# Patient Record
Sex: Female | Born: 1960
Health system: Southern US, Community
[De-identification: ages and names within clinical notes are randomized; demographics above are authoritative.]

## PROBLEM LIST (undated history)

## (undated) DIAGNOSIS — H5440 Blindness, one eye, unspecified eye: Secondary | ICD-10-CM

## (undated) DIAGNOSIS — I639 Cerebral infarction, unspecified: Secondary | ICD-10-CM

## (undated) DIAGNOSIS — I10 Essential (primary) hypertension: Secondary | ICD-10-CM

## (undated) DIAGNOSIS — F419 Anxiety disorder, unspecified: Secondary | ICD-10-CM

## (undated) DIAGNOSIS — E785 Hyperlipidemia, unspecified: Secondary | ICD-10-CM

## (undated) DIAGNOSIS — I214 Non-ST elevation (NSTEMI) myocardial infarction: Secondary | ICD-10-CM

## (undated) HISTORY — PX: TUBAL LIGATION: SHX77

## (undated) HISTORY — DX: Cerebral infarction, unspecified: I63.9

## (undated) HISTORY — DX: Non-ST elevation (NSTEMI) myocardial infarction: I21.4

## (undated) HISTORY — DX: Anxiety disorder, unspecified: F41.9

## (undated) HISTORY — DX: Blindness, one eye, unspecified eye: H54.40

## (undated) HISTORY — DX: Hyperlipidemia, unspecified: E78.5

---

## 2001-10-15 ENCOUNTER — Emergency Department (HOSPITAL_COMMUNITY): Admission: EM | Admit: 2001-10-15 | Discharge: 2001-10-15 | Payer: Self-pay | Admitting: Emergency Medicine

## 2003-10-04 ENCOUNTER — Emergency Department (HOSPITAL_COMMUNITY): Admission: EM | Admit: 2003-10-04 | Discharge: 2003-10-04 | Payer: Self-pay | Admitting: *Deleted

## 2008-04-27 ENCOUNTER — Emergency Department (HOSPITAL_COMMUNITY): Admission: EM | Admit: 2008-04-27 | Discharge: 2008-04-27 | Payer: Self-pay | Admitting: Emergency Medicine

## 2008-05-01 ENCOUNTER — Emergency Department (HOSPITAL_COMMUNITY): Admission: EM | Admit: 2008-05-01 | Discharge: 2008-05-01 | Payer: Self-pay | Admitting: Emergency Medicine

## 2010-04-18 LAB — POCT I-STAT, CHEM 8
Calcium, Ion: 1.17 mmol/L (ref 1.12–1.32)
HCT: 36 % (ref 36.0–46.0)
Hemoglobin: 12.2 g/dL (ref 12.0–15.0)
Sodium: 142 mEq/L (ref 135–145)

## 2010-10-29 ENCOUNTER — Inpatient Hospital Stay (HOSPITAL_COMMUNITY)
Admission: EM | Admit: 2010-10-29 | Discharge: 2010-10-31 | DRG: 287 | Disposition: A | Discharge status: Home / Self Care | Payer: Self-pay | Attending: Cardiology | Admitting: Cardiology

## 2010-10-29 ENCOUNTER — Emergency Department (HOSPITAL_COMMUNITY): Payer: Self-pay

## 2010-10-29 ENCOUNTER — Inpatient Hospital Stay (INDEPENDENT_AMBULATORY_CARE_PROVIDER_SITE_OTHER)
Admission: RE | Admit: 2010-10-29 | Discharge: 2010-10-29 | Disposition: A | Discharge status: Home / Self Care | Payer: 59 | Source: Ambulatory Visit | Attending: Emergency Medicine | Admitting: Emergency Medicine

## 2010-10-29 DIAGNOSIS — E876 Hypokalemia: Secondary | ICD-10-CM | POA: Diagnosis present

## 2010-10-29 DIAGNOSIS — H5440 Blindness, one eye, unspecified eye: Secondary | ICD-10-CM | POA: Diagnosis present

## 2010-10-29 DIAGNOSIS — R0789 Other chest pain: Principal | ICD-10-CM | POA: Diagnosis present

## 2010-10-29 DIAGNOSIS — I10 Essential (primary) hypertension: Secondary | ICD-10-CM | POA: Diagnosis present

## 2010-10-29 DIAGNOSIS — F17200 Nicotine dependence, unspecified, uncomplicated: Secondary | ICD-10-CM | POA: Diagnosis present

## 2010-10-29 DIAGNOSIS — R42 Dizziness and giddiness: Secondary | ICD-10-CM

## 2010-10-29 DIAGNOSIS — R079 Chest pain, unspecified: Secondary | ICD-10-CM

## 2010-10-29 DIAGNOSIS — R911 Solitary pulmonary nodule: Secondary | ICD-10-CM | POA: Diagnosis present

## 2010-10-29 HISTORY — DX: Essential (primary) hypertension: I10

## 2010-10-29 LAB — COMPREHENSIVE METABOLIC PANEL
ALT: <5 U/L (ref 0–35)
AST: 15 U/L (ref 0–37)
Albumin: 4.3 g/dL (ref 3.5–5.2)
Alkaline Phosphatase: 65 U/L (ref 39–117)
BUN: 12 mg/dL (ref 6–23)
CO2: 24 mEq/L (ref 19–32)
Calcium: 10.2 mg/dL (ref 8.4–10.5)
Chloride: 105 mEq/L (ref 96–112)
Creatinine, Ser: 0.53 mg/dL (ref 0.50–1.10)
GFR calc Af Amer: >90 mL/min (ref >90)
GFR calc non Af Amer: >90 mL/min (ref >90)
Glucose, Bld: 94 mg/dL (ref 70–99)
Potassium: 3.2 mEq/L — ABNORMAL LOW (ref 3.5–5.1)
Sodium: 143 mEq/L (ref 135–145)
Total Bilirubin: 0.4 mg/dL (ref 0.3–1.2)
Total Protein: 8.4 g/dL — ABNORMAL HIGH (ref 6.0–8.3)

## 2010-10-29 LAB — CBC
HCT: 36 % (ref 36.0–46.0)
Hemoglobin: 12.4 g/dL (ref 12.0–15.0)
MCH: 26.6 pg (ref 26.0–34.0)
MCHC: 34.4 g/dL (ref 30.0–36.0)
MCV: 77.3 fL — ABNORMAL LOW (ref 78.0–100.0)
Platelets: 319 10*3/uL (ref 150–400)
RBC: 4.66 MIL/uL (ref 3.87–5.11)
RDW: 15.4 % (ref 11.5–15.5)
WBC: 10.1 10*3/uL (ref 4.0–10.5)

## 2010-10-29 LAB — POCT I-STAT TROPONIN I: Troponin i, poc: 0.52 ng/mL (ref 0.00–0.08)

## 2010-10-29 LAB — CK TOTAL AND CKMB (NOT AT ARMC)
CK, MB: 7.4 ng/mL (ref 0.3–4.0)
Relative Index: 4.9 — ABNORMAL HIGH (ref 0.0–2.5)
Total CK: 150 U/L (ref 7–177)

## 2010-10-30 ENCOUNTER — Encounter (HOSPITAL_COMMUNITY): Payer: Self-pay | Admitting: Radiology

## 2010-10-30 ENCOUNTER — Inpatient Hospital Stay (HOSPITAL_COMMUNITY): Payer: Self-pay

## 2010-10-30 LAB — DIFFERENTIAL
Basophils Absolute: 0 10*3/uL (ref 0.0–0.1)
Basophils Relative: 0 % (ref 0–1)
Eosinophils Relative: 2 % (ref 0–5)
Lymphocytes Relative: 31 % (ref 12–46)
Monocytes Absolute: 0.4 10*3/uL (ref 0.1–1.0)
Neutrophils Relative %: 62 % (ref 43–77)

## 2010-10-30 LAB — CBC
HCT: 35.5 % — ABNORMAL LOW (ref 36.0–46.0)
Hemoglobin: 11.7 g/dL — ABNORMAL LOW (ref 12.0–15.0)
Platelets: 318 10*3/uL (ref 150–400)
RDW: 15.5 % (ref 11.5–15.5)
WBC: 9 10*3/uL (ref 4.0–10.5)

## 2010-10-30 LAB — BASIC METABOLIC PANEL
BUN: 10 mg/dL (ref 6–23)
CO2: 23 mEq/L (ref 19–32)
Chloride: 106 mEq/L (ref 96–112)
GFR calc Af Amer: >90 mL/min (ref >90)
Glucose, Bld: 93 mg/dL (ref 70–99)

## 2010-10-30 LAB — LIPID PANEL: HDL: 44 mg/dL (ref >39)

## 2010-10-30 LAB — CARDIAC PANEL(CRET KIN+CKTOT+MB+TROPI)
CK, MB: 3.6 ng/mL (ref 0.3–4.0)
Relative Index: 2.9 — ABNORMAL HIGH (ref 0.0–2.5)
Total CK: 126 U/L (ref 7–177)
Total CK: 148 U/L (ref 7–177)

## 2010-10-30 LAB — TSH: TSH: 1.038 u[IU]/mL (ref 0.350–4.500)

## 2010-10-30 MED ORDER — IOHEXOL 300 MG/ML  SOLN
80.0000 mL | Freq: Once | INTRAMUSCULAR | Status: AC | PRN
  Administered 2010-10-30: 80 mL via INTRAVENOUS

## 2010-10-31 LAB — CBC
HCT: 33.8 % — ABNORMAL LOW (ref 36.0–46.0)
MCH: 26 pg (ref 26.0–34.0)
MCV: 77.7 fL — ABNORMAL LOW (ref 78.0–100.0)
RBC: 4.35 MIL/uL (ref 3.87–5.11)
WBC: 8.7 10*3/uL (ref 4.0–10.5)

## 2010-10-31 LAB — BASIC METABOLIC PANEL
BUN: 12 mg/dL (ref 6–23)
CO2: 26 mEq/L (ref 19–32)
Calcium: 9.9 mg/dL (ref 8.4–10.5)
Chloride: 104 mEq/L (ref 96–112)
Creatinine, Ser: 0.8 mg/dL (ref 0.50–1.10)
Glucose, Bld: 96 mg/dL (ref 70–99)

## 2010-10-31 LAB — DIFFERENTIAL
Lymphocytes Relative: 29 % (ref 12–46)
Lymphs Abs: 2.6 10*3/uL (ref 0.7–4.0)
Monocytes Relative: 5 % (ref 3–12)
Neutrophils Relative %: 63 % (ref 43–77)

## 2010-11-01 NOTE — Cardiovascular Report (Signed)
Natalie Bishop, Natalie Bishop NO.:  [id number]  MEDICAL RECORD NO.:  [id number]  LOCATION:  2507                         FACILITY:  [facility]  PHYSICIAN:  Verne Carrow, MDDATE OF BIRTH:  May 07, 1960  DATE OF PROCEDURE:  10/30/2010 DATE OF DISCHARGE:                           CARDIAC CATHETERIZATION   PRIMARY CARDIOLOGIST:  Rollene Rotunda, MD, [facility]  PROCEDURES PERFORMED: 1. Left heart catheterization. 2. Selective coronary angiography. 3. Left ventricular angiogram.  OPERATOR:  Verne Carrow, MD  INDICATION:  This is a 50 year old African American female with a history of hypertension, who was admitted to the hospital yesterday with complaints of shortness of breath and chest pain.  Her troponin was elevated at 1.5.  The patient had no EKG changes suggestive of ischemia. She was watched closely overnight.  Her troponin has started to trend back down today.  She has had no recurrent chest pain today.  DETAILS OF PROCEDURE:  The patient was brought to the main cardiac catheterization laboratory after signing informed consent for the procedure.  The right wrist was assessed with an Freida Busman test.  The [place] test was positive.  Right wrist was then prepped and draped in sterile fashion.  Lidocaine 1% was used for local anesthesia.  A 5-French sheath was inserted into the right radial artery without difficulty.  Verapamil 3 mg was given through the sheath after insertion.  4000 units of intravenous heparin was given after sheath insertion.  A JR4 catheter was used to selectively engage and inject the right coronary artery.  We then had considerable difficulty engaging the left main artery with a Judkins left catheter.  Ultimately, we used an EBU 3.0 guiding catheter to selectively engage the left main artery and perform angiography of the left coronary system.  A pigtail catheter was used to perform a left ventricular angiogram.  The patient tolerated the  procedure well.  The sheath was removed from the right radial artery here in the cath lab and a Terumo hemostasis band was applied over the arteriotomy site.  The patient was taken to the holding area in stable condition.  HEMODYNAMIC FINDINGS:  Central aortic pressure 126/79, left ventricular pressure 119/7/8.  ANGIOGRAPHIC FINDINGS: 1. The left main coronary artery had no evidence of disease. 2. The left anterior descending was a large vessel that coursed to the     apex and gave off an early moderate-sized diagonal branch.  There     were no lesions noted in this system. 3. The circumflex artery was a large-caliber vessel and gave off a     relatively small-caliber early obtuse marginal branch that is free     of disease.  Second obtuse marginal branch was a large bifurcating     vessel with no evidence of disease.  The AV groove circumflex was     moderate size with no evidence of disease. 4. The right coronary artery is a large dominant vessel with no     evidence of disease. 5. Left ventricular angiogram was performed in the RAO projection and     showed normal left ventricular systolic function with ejection     fraction of 55%-60%.  IMPRESSION: 1. No angiographic evidence  of coronary artery disease. 2. Normal left ventricular systolic function. 3. Abnormal cardiac enzymes in a patient with atypical chest pain.     Etiology unknown at this time.  I will pursue a workup of pulmonary     embolism.  RECOMMENDATIONS:  At this time, I do not see any objective evidence of coronary artery disease or ischemia.  I would like to check a D-dimer to start the workup for pulmonary embolism.  The patient will be monitored closely for the next 24 hours.     Verne Carrow, MD     CM/MEDQ  D:  10/30/2010  T:  10/30/2010  Job:  [id number]  cc:   Rollene Rotunda, MD, [facility]  Electronically Signed by Verne Carrow MD on 11/01/2010 01:52:17 PM

## 2010-11-01 NOTE — Discharge Summary (Addendum)
NAMEMERLEEN, Natalie Bishop NO.:  [id number]  MEDICAL RECORD NO.:  [id number]  LOCATION:  2507                         FACILITY:  [facility]  PHYSICIAN:  Verne Carrow, MDDATE OF BIRTH:  05/01/1960  DATE OF ADMISSION:  10/29/2010 DATE OF DISCHARGE:  10/31/2010                              DISCHARGE SUMMARY   DISCHARGE DIAGNOSES: 1. Chest pain with elevated cardiac enzymes.     a.     Normal coronary arteries by cath on October 30, 2010.     b.     CT angio negative for pulmonary embolism. 2. Hypertensive urgency. 3. Tobacco abuse. 4. Two 4-mm pulmonary nodules by CT on October 30, 2010, instructed to     follow up with PCP for possible repeat CT in 1 year.  HOSPITAL COURSE:  Natalie Bishop is a 50 year old lady with no prior cardiac history, but a history of hypertension who presented to [facility] with complaints of chest pain.  It was felt somewhat atypical in nature.  She describes as a sticking discomfort above her left breast, that would come and go, with some right breast discomfort worse with movement.  She has also had some heartburn like symptoms and some diaphoresis and hot flashes.  She presented to the ER where she was found to have no acute EKG changes.  However, she was quite hypertensive with a blood pressure of 166/113.  Her initial troponins were positive, 0.52.  She was admitted to the hospital and placed on heparin, aspirin, beta-blockers, and nitroglycerin paste.  She was set up for catheterization the following day demonstrating no evidence of CAD with normal LV function.  Given her positive cardiac enzymes, she had a D- dimer which was just on the cuts of being elevated/normal at 0.48. Subsequent CT angio of the chest demonstrated no evidence for PE. However, two 4-mm nodules were present within the right lung, felt possibly inflammatory, recommending 1 year followup CT per Radiology. On the day of discharge, the patient is feeling  better.  Her medications have been uptitrated for her blood pressure.  Dr. Shirlee Latch has seen and examined her today and feels she is stable for discharge.  DISCHARGE LABS:  WBC 8.7, hemoglobin 11.3, hematocrit 33.8, and platelet count 292.  Sodium 140, potassium 3.5, chloride 104, CO2 of 26, glucose 96, BUN 12, creatinine 0.84.  STUDIES: 1. CT angio of the chest as commented on above. 2. On October 29, 2010, chest x-ray showed retrocardiac atelectasis or     pneumonia.  Please note the patient was afebrile with a normal     white count. 3. Cardiac catheterization on October 30, 2010, please see full report     for details as well as HPI for summary.  DISCHARGE MEDICATIONS: 1. Aspirin 81 mg daily. 2. Lopressor 50 mg b.i.d. 3. Norvasc 5 mg daily.  DISPOSITION:  Natalie Bishop will be discharged in stable condition to home. She is instructed to increase activity slowly and is not to lift anything over 5 pounds for 1 week, drive for 2 days, or participate in sexual activity for 1 week.  She may return to work per Dr. Clifton James. She is to follow a  low-sodium, heart-healthy diet.  She is to call or return for pain, swelling, bleeding, or pus at her cath site.  She will follow up with Tereso Newcomer, [facility], on November 13, 2010, at 10 a.m.  She is also to follow up with her PCP regarding continued monitoring of the pulmonary nodules noted on her CT scan of the chest.  Her cholesterol level should also be followed by her PCP.  Duration of discharge encounter is greater than 30 minutes including physician and PA time.     Ronie Spies, P.A.C.   ______________________________ Verne Carrow, MD    DD/MEDQ  D:  10/31/2010  T:  10/31/2010  Job:  [id number]  Electronically Signed by Verne Carrow MD on 11/01/2010 01:52:19 PM Electronically Signed by Ronie Spies  on 11/06/2010 02:00:20 PM

## 2010-11-06 NOTE — H&P (Addendum)
NAMEZITA, OZIMEK               ACCOUNT NO.:  [id number]  MEDICAL RECORD NO.:  [id number]  LOCATION:  URG                          FACILITY:  [facility]  PHYSICIAN:  Rollene Rotunda, MD, FACCDATE OF BIRTH:  Dec 27, 1960  DATE OF ADMISSION:  10/29/2010 DATE OF DISCHARGE:  10/29/2010                             HISTORY & PHYSICAL   PRIMARY:  None.  CARDIOLOGIST:  None.  REASON FOR CONSULTATION:  Evaluate the patient with chest pain.  HISTORY OF PRESENT ILLNESS:  The patient is a pleasant 50 year old without prior cardiac history.  She has had some atypical chest discomfort.  In retrospect, she has had a little of this in her right chest for over the past couple of months, but it has been very sporadic. She had some discomfort that she described as a "sticking" discomfort above her left breast starting last night.  It would come and go.  She was somewhat dizzy.  She had some right breast discomfort, worse with movement, but also occurring at rest last night.  It was happening again today.  She was somewhat short of breath.  She had some heartburn-like symptoms.  She had some diaphoresis, but she has hot flashes.  She did feel some nausea, but no vomiting.  She presented to the emergency room where she was not found to have acute EKG changes.  However, she is quite hypertensive.  Her cardiac enzyme troponins were slightly elevated.  She is pain free.  PAST MEDICAL HISTORY: 1. Hypertension. 2. 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  SURGICAL HISTORY:  None.  ALLERGIES:  PENICILLIN.  MEDICATIONS:  None.  SOCIAL HISTORY:  The patient is married.  She has 2 children.  She has been smoking for maybe up to 1 and half packs per day for 31 years.  FAMILY HISTORY:  Contributory for her mother having coronary disease in her 26s and dying of diabetes at age 52.  REVIEW OF SYSTEMS:  As stated in the HPI, negative for all other systems.  PHYSICAL EXAMINATION:  GENERAL:  The patient is  pleasant and in no distress. VITAL SIGNS:  Blood pressure 166/113, heart rate 70 and regular, respiratory rate 18, afebrile. HEENT:  Eyes unremarkable.  Right pupil round reactive.  She has dysconjugate gaze and her left pupil is unreactive.  Oral mucosa unremarkable, poor dentition. NECK:  No jugular venous distention at 45 degrees, carotid upstroke brisk and symmetric, no bruits, no thyromegaly. LYMPHATICS:  No cervical, axillary, inguinal adenopathy. LUNGS:  Clear to auscultation bilaterally.  BACK:  No costovertebral angle tenderness. CHEST:  Unremarkable. HEART:  PMI not displaced or sustained, S1 and S2 within normal limits, no S3, no S4, no clicks, rubs, no murmurs. ABDOMEN:  Flat, positive bowel sounds.  Normal in frequency and pitch. No bruits.  No rebound.  No guarding.  No midline pulsatile mass.  No hepatomegaly.  No splenomegaly. SKIN:  No rashes, no nodules. EXTREMITIES:  2+ pulses throughout.  No edema, no cyanosis, no clubbing. NEUROLOGIC:  Oriented to person, place, time, cranial nerves II through XII grossly intact, motor grossly intact.  EKG, sinus rhythm, rate 70, axis within normal limits, intervals within normal limits, no acute ST-T wave  changes.  ASSESSMENT/PLAN: 1. Chest pain.  The patient's chest discomfort is atypical.  However,     she has multiple cardiovascular risk factors and now elevated     troponins.  I will admit her with heparin, aspirin, beta blockers,     and nitroglycerin paste.  I have discussed the risks and benefits     of cardiac catheterization with her and her family.  She     understands and agrees to proceed, which we will do electively. 2. Hypertension.  I will add Norvasc to the medications above. 3. Risk reduction.  I will check a lipid profile and TSH. 4. Tobacco.  We had a long discussion about this and I will get a     smoking consult.     Rollene Rotunda, MD, [facility]   ______________________________ Rollene Rotunda, MD,  [facility]    JH/MEDQ  D:  10/29/2010  T:  10/30/2010  Job:  [id number]  Electronically Signed by Rollene Rotunda MD [facility] on 11/06/2010 05:46:30 PM

## 2010-11-12 ENCOUNTER — Encounter: Payer: Self-pay | Admitting: Physician Assistant

## 2010-11-13 ENCOUNTER — Encounter: Payer: Self-pay | Admitting: Physician Assistant

## 2010-11-13 ENCOUNTER — Ambulatory Visit (INDEPENDENT_AMBULATORY_CARE_PROVIDER_SITE_OTHER): Payer: Self-pay | Admitting: Physician Assistant

## 2010-11-13 VITALS — BP 152/76 | HR 80 | Resp 18 | Ht 66.0 in | Wt 163.1 lb

## 2010-11-13 DIAGNOSIS — I10 Essential (primary) hypertension: Secondary | ICD-10-CM

## 2010-11-13 DIAGNOSIS — E785 Hyperlipidemia, unspecified: Secondary | ICD-10-CM

## 2010-11-13 DIAGNOSIS — I214 Non-ST elevation (NSTEMI) myocardial infarction: Secondary | ICD-10-CM

## 2010-11-13 DIAGNOSIS — D509 Iron deficiency anemia, unspecified: Secondary | ICD-10-CM | POA: Insufficient documentation

## 2010-11-13 DIAGNOSIS — J984 Other disorders of lung: Secondary | ICD-10-CM

## 2010-11-13 DIAGNOSIS — R911 Solitary pulmonary nodule: Secondary | ICD-10-CM

## 2010-11-13 MED ORDER — PRAVASTATIN SODIUM 40 MG PO TABS: 40.0000 mg | ORAL_TABLET | Freq: Every evening | ORAL | Status: DC

## 2010-11-13 MED ORDER — AMLODIPINE BESYLATE 5 MG PO TABS: 10.0000 mg | ORAL_TABLET | Freq: Every day | ORAL | Status: DC

## 2010-11-13 NOTE — Assessment & Plan Note (Signed)
Mild.  Arrange referral to PCP.

## 2010-11-13 NOTE — Patient Instructions (Addendum)
Your physician recommends that you schedule a follow-up appointment in: 01/04/11 @ 12 PM TO SEE SCOTT WEAVER, PA-C   Your physician has recommended you make the following change in your medication: START PRAVASTATIN 40 MG 1 TABLET EVERY NIGHT; INCREASE NORVASC 10 MG DAILY  Your physician recommends that you return for lab work in: 01/04/11 FASTING LIVER/LIPID PANEL SAME DAY YOU HAVE APPT TO SEE SCOTT WEAVER, PA-C @ 12 PM  You have been referred to [facility] TO ESTABLISH WITH A PRIMARY CARE PHYSICIAN FOR HTN; WE WILL ALSO CALL YOU FOR AN APPT TO HAVE A FOLLOW UP CHEST CT FOR YOUR LUNG NODULE 518.89 TO BE DONE 11/08/2011

## 2010-11-13 NOTE — Assessment & Plan Note (Signed)
?   Type 2 related to HTN urgency.  Continue to control BP.

## 2010-11-13 NOTE — Assessment & Plan Note (Signed)
Although no CAD on cath, her LDL is very high and she has significant risk factors.  Recommend statin therapy for primary prevention.  Will start pravastatin 40 mg qhs and check L/L in 6 weeks.  Goal LDL < 130.

## 2010-11-13 NOTE — Assessment & Plan Note (Signed)
Uncontrolled.  Increase norvasc to 10 mg QD.  Follow up in 6 weeks.  If no better, consider adding ACE or HCTZ.  Will arrange PCP referral.

## 2010-11-13 NOTE — Progress Notes (Signed)
History of Present Illness: Primary Cardiologist:  Dr. Rollene Rotunda   Natalie Bishop is a 50 y.o. female who presents for post hospital follow up.  She was admitted 10/22-10/24 with chest pain and HTN urgency (BP 166/113).  She ruled in for NSTEMI.  LHC 10/29/10: normal cors, EF 55-60%.  DDimer was checked and this was borderline.  Chest CT: no pulmonary emboli, 2 pulmonary nodules ms 4 mm in right lobe (follow up recommended in one year).  No cause was found for elevated markers.  BP was treated and she presents for follow up today.  Pertinent hospital labs:  K 3.5, creatinine 0.8, Hgb 11.3, MCV 77.7, peak Troponin 1.52, TSH 1.0388, TC 240, TG 166, HDL 4, LDL 163.    The patient denies chest pain, shortness of breath, syncope, orthopnea, PND or significant pedal edema.   Past Medical History  Diagnosis Date  . Hypertension   . 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  . NSTEMI (non-ST elevated myocardial infarction)     ? type 2 from HTN urgency 10/2010: LHC with normal cors, EF 55-60%; chest CTA neg for pulmonary emboli  . HLD (hyperlipidemia)     Current Outpatient Prescriptions  Medication Sig Dispense Refill  . amLODipine (NORVASC) 5 MG tablet Take 5 mg by mouth daily.        Marland Kitchen aspirin 81 MG tablet Take 81 mg by mouth daily.        . metoprolol (LOPRESSOR) 50 MG tablet Take 50 mg by mouth 2 (two) times daily.          Allergies: Allergies  Allergen Reactions  . Penicillins     History  Substance Use Topics  . Smoking status: Current Some Day Smoker -- 0.5 packs/day    Types: Cigarettes  . Smokeless tobacco: Not on file  . Alcohol Use: Not on file     Family History  Problem Relation Age of Onset  . Coronary artery disease Mother   . Diabetes Mother 96  . Diabetes type II Mother   . Heart attack Mother 79    MI     Vital Signs: BP 152/76  Pulse 80  Resp 18  Ht 5\' 6"  (1.676 m)  Wt 163 lb 1.9 oz (73.991 kg)  BMI 26.33 kg/m2  PHYSICAL EXAM: Well  nourished, well developed, in no acute distress HEENT: normal Neck: no JVD Vascular: no carotid bruits Cardiac:  normal S1, S2; RRR; no murmur Lungs:  clear to auscultation bilaterally, no wheezing, rhonchi or rales Abd: soft, nontender, no hepatomegaly Ext: no edema; right wrist without hematoma or bruit Skin: warm and dry Neuro:  CNs 2-12 intact, no focal abnormalities noted  EKG:  NSR, HR 74, normal axis, no acute changes  ASSESSMENT AND PLAN:

## 2010-11-13 NOTE — Assessment & Plan Note (Signed)
Arrange follow up CT in one year.  We discussed different strategies to quit smoking today.

## 2011-01-04 ENCOUNTER — Ambulatory Visit: Payer: Self-pay | Admitting: Physician Assistant

## 2011-01-07 ENCOUNTER — Telehealth: Payer: Self-pay | Admitting: Cardiology

## 2011-01-07 NOTE — Telephone Encounter (Signed)
New Msg: pt calling wanting to know if she can take abilify, per MD at [facility]. Please return pt call to discuss further.

## 2011-01-07 NOTE — Telephone Encounter (Signed)
Advised Dr Antoine Poche out of the office until next week, will forward to him.  Patient stated she had appointment today at [facility]

## 2011-01-15 NOTE — Telephone Encounter (Signed)
Ok to use Abilify from a cardiac standpoint.

## 2011-01-16 NOTE — Telephone Encounter (Signed)
Left message for pt that it is OK for pt to use Abilify.

## 2011-01-21 ENCOUNTER — Encounter: Payer: Self-pay | Admitting: Physician Assistant

## 2011-01-21 DIAGNOSIS — I10 Essential (primary) hypertension: Secondary | ICD-10-CM

## 2011-01-21 DIAGNOSIS — E785 Hyperlipidemia, unspecified: Secondary | ICD-10-CM

## 2011-01-21 DIAGNOSIS — I214 Non-ST elevation (NSTEMI) myocardial infarction: Secondary | ICD-10-CM

## 2011-01-21 LAB — LIPID PANEL
Cholesterol: 170 mg/dL (ref 0–200)
LDL Cholesterol: 103 mg/dL — ABNORMAL HIGH (ref 0–99)

## 2011-01-21 LAB — HEPATIC FUNCTION PANEL
ALT: 8 U/L (ref 0–35)
AST: 16 U/L (ref 0–37)
Albumin: 4.3 g/dL (ref 3.5–5.2)
Total Protein: 7.9 g/dL (ref 6.0–8.3)

## 2011-01-21 NOTE — Progress Notes (Signed)
Patient mistakenly left after labs drawn and was not seen for office visit. Appointment will be rescheduled. Tereso Newcomer, PA-C  1:52 PM 01/21/2011   This encounter was created in error - please disregard.

## 2011-01-22 ENCOUNTER — Encounter: Payer: Self-pay | Admitting: Cardiology

## 2011-01-22 ENCOUNTER — Ambulatory Visit (INDEPENDENT_AMBULATORY_CARE_PROVIDER_SITE_OTHER): Payer: Self-pay | Admitting: Cardiology

## 2011-01-22 DIAGNOSIS — Z720 Tobacco use: Secondary | ICD-10-CM | POA: Insufficient documentation

## 2011-01-22 DIAGNOSIS — F17200 Nicotine dependence, unspecified, uncomplicated: Secondary | ICD-10-CM

## 2011-01-22 DIAGNOSIS — R911 Solitary pulmonary nodule: Secondary | ICD-10-CM

## 2011-01-22 DIAGNOSIS — I10 Essential (primary) hypertension: Secondary | ICD-10-CM

## 2011-01-22 DIAGNOSIS — J984 Other disorders of lung: Secondary | ICD-10-CM

## 2011-01-22 NOTE — Assessment & Plan Note (Signed)
Her blood pressure is well controlled. She will continue the meds as listed. 

## 2011-01-22 NOTE — Assessment & Plan Note (Signed)
We discussed the need to stop smoking completely. She will try.

## 2011-01-22 NOTE — Assessment & Plan Note (Signed)
We'll make sure she has followup scheduled for these nodules. She is aware of this.

## 2011-01-22 NOTE — Progress Notes (Signed)
   HPI The patient presents for followup of her difficult to control hypertension with a non-Q-wave myocardial infarction.  Since having her meds adjusted at her most recent appointment her blood pressure looks much better. She denies any chest pressure, neck or arm discomfort. She denies any shortness of breath, PND or orthopnea. She denies any weight gain or edema. She's doing some walking for exercise. Unfortunately she still smoking a couple of cigarettes daily.  Allergies  Allergen Reactions  . Penicillins     Current Outpatient Prescriptions  Medication Sig Dispense Refill  . amLODipine (NORVASC) 5 MG tablet Take 2 tablets (10 mg total) by mouth daily.  60 tablet  11  . aspirin 81 MG tablet Take 81 mg by mouth daily.        . metoprolol (LOPRESSOR) 50 MG tablet Take 50 mg by mouth 2 (two) times daily.        . pravastatin (PRAVACHOL) 40 MG tablet Take 1 tablet (40 mg total) by mouth every evening.  30 tablet  11    Past Medical History  Diagnosis Date  . Hypertension   . 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  . NSTEMI (non-ST elevated myocardial infarction)     ? type 2 from HTN urgency 10/2010: LHC with normal cors, EF 55-60%; chest CTA neg for pulmonary emboli  . HLD (hyperlipidemia)     No past surgical history on file.  ROS:  As stated in the HPI and negative for all other systems.  PHYSICAL EXAM BP 126/84  Pulse 88  Resp 18  Ht 5\' 6"  (1.676 m)  Wt 167 lb (75.751 kg)  BMI 26.95 kg/m2 GENERAL:  Well appearing HEENT:  Pupils equal round and reactive, fundi not visualized, oral mucosa unremarkable, poor dentition, disconjugate gaze NECK:  No jugular venous distention, waveform within normal limits, carotid upstroke brisk and symmetric, no bruits, no thyromegaly LYMPHATICS:  No cervical, inguinal adenopathy LUNGS:  Clear to auscultation bilaterally BACK:  No CVA tenderness CHEST:  Unremarkable HEART:  PMI not displaced or sustained,S1 and S2 within normal  limits, no S3, no S4, no clicks, no rubs, no murmurs ABD:  Flat, positive bowel sounds normal in frequency in pitch, no bruits, no rebound, no guarding, no midline pulsatile mass, no hepatomegaly, no splenomegaly EXT:  2 plus pulses throughout, no edema, no cyanosis no clubbing SKIN:  No rashes no nodules NEURO:  Cranial nerves II through XII grossly intact, motor grossly intact throughout PSYCH:  Cognitively intact, oriented to person place and time  ASSESSMENT AND PLAN

## 2011-01-22 NOTE — Patient Instructions (Signed)
Your physician wants you to follow-up in: 6 MONTHS WITH SCOTT WEAVER [facility] You will receive a reminder letter in the mail two months in advance. If you don't receive a letter, please call our office to schedule the follow-up appointment.   MELITONIN FOR SLEEP

## 2011-01-23 ENCOUNTER — Encounter: Payer: Self-pay | Admitting: *Deleted

## 2011-01-23 NOTE — Telephone Encounter (Signed)
This encounter was created in error - please disregard.

## 2011-02-12 ENCOUNTER — Ambulatory Visit: Payer: Self-pay | Admitting: Family Medicine

## 2011-02-27 ENCOUNTER — Encounter: Payer: Self-pay | Admitting: Family Medicine

## 2011-02-27 ENCOUNTER — Ambulatory Visit (INDEPENDENT_AMBULATORY_CARE_PROVIDER_SITE_OTHER): Payer: Self-pay | Admitting: Family Medicine

## 2011-02-27 VITALS — BP 137/93 | HR 81 | Temp 98.3°F | Ht 66.0 in | Wt 164.2 lb

## 2011-02-27 DIAGNOSIS — D1722 Benign lipomatous neoplasm of skin and subcutaneous tissue of left arm: Secondary | ICD-10-CM

## 2011-02-27 DIAGNOSIS — I10 Essential (primary) hypertension: Secondary | ICD-10-CM

## 2011-02-27 DIAGNOSIS — Z23 Encounter for immunization: Secondary | ICD-10-CM

## 2011-02-27 DIAGNOSIS — J069 Acute upper respiratory infection, unspecified: Secondary | ICD-10-CM

## 2011-02-27 DIAGNOSIS — D1739 Benign lipomatous neoplasm of skin and subcutaneous tissue of other sites: Secondary | ICD-10-CM

## 2011-02-27 MED ORDER — BENZONATATE 100 MG PO CAPS: 100.0000 mg | ORAL_CAPSULE | Freq: Two times a day (BID) | ORAL | Status: DC | PRN

## 2011-02-27 NOTE — Patient Instructions (Signed)
It was nice to meet you today. Please take Tessalon perles as needed for cough. Drink plenty of fluids and get plenty of rest - this is probably a viral cold. It would help if you cut back on cigarette smoking as well. Please apply for [facility] card. Schedule a complete physical with me at your earliest convenience.

## 2011-03-03 DIAGNOSIS — D1722 Benign lipomatous neoplasm of skin and subcutaneous tissue of left arm: Secondary | ICD-10-CM | POA: Insufficient documentation

## 2011-03-03 DIAGNOSIS — J069 Acute upper respiratory infection, unspecified: Secondary | ICD-10-CM | POA: Insufficient documentation

## 2011-03-03 NOTE — Assessment & Plan Note (Signed)
Knot on left arm likely sebaceous cyst vs. Lipoma.  Does not irritate patient.  Will monitor for now.  If becomes bothersome, may consider removing cyst in the office.

## 2011-03-03 NOTE — Assessment & Plan Note (Signed)
Conservative management.  Advised to cut back on smoking.  Refer to AVS.

## 2011-03-03 NOTE — Progress Notes (Signed)
  Subjective:    Patient ID: Natalie Bishop, female    DOB: 03/29/1960, 51 y.o.   MRN: [id number]  HPI  Patient presents to clinic to establish PCP, referred by cardiologist.  Patient complains of cough (yellow phlegm), rhinorrhea that started 5 days ago.  Denies any fever, chills, NS, nausea/vomiting, decreased appetite.  She does smoke 5 cigarettes per day, but cough is new.  She has not tried any OTC medications yet.  Denies headache, diarrhea, chest or abdominal pain.  Patient also complains of a knot on her left arm.  Says it has been there for several months.  It does not bother her, but she bumped her arm the other day and wants me to look at it.  It has not changed in size, nor is it red, painful, or itchy.  No associated symptoms.   Review of Systems  Per HPI    Objective:   Physical Exam  Constitutional: No distress.  HENT:  Head: Normocephalic and atraumatic.  Mouth/Throat: Oropharynx is clear and moist.  Eyes: Conjunctivae are normal.  Neck: Normal range of motion. Neck supple. No JVD present.  Cardiovascular: Normal rate, regular rhythm and normal heart sounds.   No murmur heard. Pulmonary/Chest: Effort normal and breath sounds normal. She has no wheezes. She has no rales.  Musculoskeletal: She exhibits no edema.  Neurological: She is alert.  Skin:       Left lateral upper arm below shoulder - round, firm, mobile, non-tender mass about 1 cm in diameter; no erythema, induration, or bruising          Assessment & Plan:

## 2011-03-03 NOTE — Assessment & Plan Note (Signed)
BP well controlled.  Followed by Cardiology.

## 2011-03-13 ENCOUNTER — Encounter: Payer: Self-pay | Admitting: Family Medicine

## 2011-03-13 ENCOUNTER — Ambulatory Visit (INDEPENDENT_AMBULATORY_CARE_PROVIDER_SITE_OTHER): Payer: Self-pay | Admitting: Family Medicine

## 2011-03-13 DIAGNOSIS — Z207 Contact with and (suspected) exposure to pediculosis, acariasis and other infestations: Secondary | ICD-10-CM

## 2011-03-13 DIAGNOSIS — Z2089 Contact with and (suspected) exposure to other communicable diseases: Secondary | ICD-10-CM

## 2011-03-13 DIAGNOSIS — I10 Essential (primary) hypertension: Secondary | ICD-10-CM

## 2011-03-13 MED ORDER — PERMETHRIN 5 % EX CREA: TOPICAL_CREAM | Freq: Once | CUTANEOUS | Status: AC

## 2011-03-13 NOTE — Assessment & Plan Note (Signed)
Rash likely scabies vs. Bed bugs. Treat with permethrin cream x 1. Gave handout with instructions.  Husband to be treated as well. Follow up as needed.

## 2011-03-13 NOTE — Patient Instructions (Signed)
It was good to see you today. Please pick up Permethrin cream and apply as directed. Schedule a colonoscopy at your earliest convenience. Once you have your [facility] card, you may schedule a complete physical appointment with me. While you are waiting for [facility] and your BP medications, avoid foods high in sodium, avoid eating fast foods, and try to exercise daily to keep your BP down. If you experience any severe headache, nausea/vomiting, one-sided weakness, or light-headedness, please call MD or return to clinic. Follow up as needed.  Scabies Scabies are small bugs (mites) that burrow under the skin and cause red bumps and severe itching. These bugs can only be seen with a microscope. Scabies are highly contagious. They can spread easily from person to person by direct contact. They are also spread through sharing clothing or linens that have the scabies mites living in them. It is not unusual for an entire family to become infected through shared towels, clothing, or bedding.  HOME CARE INSTRUCTIONS   Your caregiver may prescribe a cream or lotion to kill the mites. If this cream is prescribed; massage the cream into the entire area of the body from the neck to the bottom of both feet. Also massage the cream into the scalp and face if your child is less than 29 year old. Avoid the eyes and mouth.   Leave the cream on for 8 to12 hours. Do not wash your hands after application. Your child should bathe or shower after the 8 to 12 hour application period. Sometimes it is helpful to apply the cream to your child at right before bedtime.   One treatment is usually effective and will eliminate approximately 95% of infestations. For severe cases, your caregiver may decide to repeat the treatment in 1 week. Everyone in your household should be treated with one application of the cream.   New rashes or burrows should not appear after successful treatment within 24 to 48 hours; however the itching  and rash may last for 2 to 4 weeks after successful treatment. If your symptoms persist longer than this, see your caregiver.   Your caregiver also may prescribe a medication to help with the itching or to help the rash go away more quickly.   Scabies can live on clothing or linens for up to 3 days. Your entire child's recently used clothing, towels, stuffed toys, and bed linens should be washed in hot water and then dried in a dryer for at least 20 minutes on high heat. Items that cannot be washed should be enclosed in a plastic bag for at least 3 days.   To help relieve itching, bathe your child in a cool bath or apply cool washcloths to the affected areas.   Your child may return to school after treatment with the prescribed cream.  SEEK MEDICAL CARE IF:   The itching persists longer than 4 weeks after treatment.   The rash spreads or becomes infected (the area has red blisters or yellow-tan crust).  Document Released: 12/24/2004 Document Revised: 12/13/2010 Document Reviewed: 05/04/2008 [facility] Patient Information 2012 [place], [place].

## 2011-03-13 NOTE — Progress Notes (Signed)
  Subjective:    Patient ID: Natalie Bishop, female    DOB: August 07, 1960, 51 y.o.   MRN: [id number]  HPI  Patient presents to clinic to discuss rash and follow up BP.  HTN:  BP elevated today.  Patient has not been able to afford medications and has not taken any for 3 days.  She does not know when she will be able to pay for them.  She is meeting with Jaynee Eagles after clinic today.  Patient denies any headache, numbness/tingling of extremities, nausea/vomiting, CP, SOB, or lightheadedness/weakness.  Rash: started 2 weeks ago after staying in motel.  She and patient were living in a motel for a few weeks and both developed a rash.  They moved out but she still complaints of itchy rash on bilateral arms, abdomen, and legs.  Has not tried any OTC creams or medications.  She would like to be treated with permethrin but afraid she will not be able to afford it.  Denies any fever, chills, NS.  I reviewed patient's smoking history, PMH, medications, and allergies.  Review of Systems  Per HPI    Objective:   Physical Exam  Constitutional: No distress.  Cardiovascular: Normal heart sounds.   Pulmonary/Chest: Breath sounds normal.  Skin:       Round, maculo-papular, erythematous rash located on bilateral forearms, abdomen, and few scattered lesions on bilateral LE.  Dry skin throughout.  Face is spared.  No evidence of infection or active bleeding.          Assessment & Plan:

## 2011-03-13 NOTE — Assessment & Plan Note (Signed)
Elevated 139/99 because patient cannot afford medications. She is to meet with Jaynee Eagles today and hopefully be eligible for MAP. Rx still at her pharmacy waiting for pick up. Recommended cutting back on cigarette smoking, high sodium foods, and to increase exercise while she is off BP medications.

## 2011-04-17 ENCOUNTER — Ambulatory Visit (INDEPENDENT_AMBULATORY_CARE_PROVIDER_SITE_OTHER): Payer: Self-pay | Admitting: Family Medicine

## 2011-04-17 ENCOUNTER — Encounter: Payer: Self-pay | Admitting: Family Medicine

## 2011-04-17 VITALS — BP 121/84 | HR 114 | Ht 66.0 in | Wt 162.0 lb

## 2011-04-17 DIAGNOSIS — K0889 Other specified disorders of teeth and supporting structures: Secondary | ICD-10-CM

## 2011-04-17 DIAGNOSIS — K029 Dental caries, unspecified: Secondary | ICD-10-CM | POA: Insufficient documentation

## 2011-04-17 DIAGNOSIS — K089 Disorder of teeth and supporting structures, unspecified: Secondary | ICD-10-CM

## 2011-04-17 MED ORDER — IBUPROFEN 800 MG PO TABS: 800.0000 mg | ORAL_TABLET | Freq: Three times a day (TID) | ORAL | Status: AC | PRN

## 2011-04-17 MED ORDER — CLINDAMYCIN HCL 150 MG PO CAPS: 300.0000 mg | ORAL_CAPSULE | Freq: Three times a day (TID) | ORAL | Status: AC

## 2011-04-17 NOTE — Assessment & Plan Note (Signed)
Patient has multiple dental caries, gingivitis, and now complains of teeth falling out. Will refer to Dentist - patient has orange card.  Will try to get patient into see a dentist ASAP. Treat pain with Motrin 800 and possible infection with Clinda 300 TID x 7 days. Start Multi-vitamin. Red flags reviewed.  See AVS.

## 2011-04-17 NOTE — Progress Notes (Signed)
  Subjective:    Patient ID: Natalie Bishop, female    DOB: 1960/02/10, 51 y.o.   MRN: [id number]  HPI  Patient presents to clinic c/o dental pain and "losing teeth."  Patient states that two teeth fell out last week and two teeth fell out this week.  She says "teeth just keep falling out."  Denies any gum bleeding.  Does complain of gum pain on both top and bottom.  Denies any fevers, chills, NS, nausea or vomiting.  Patient has the orange card and requesting Dental referral.  Patient does smoke about 5 cigarettes per day.  She cannot remember last time she was seen by a dentist.    Review of Systems  Per HPI    Objective:   Physical Exam  Constitutional: No distress.  HENT:  Head: Normocephalic and atraumatic.  Right Ear: External ear normal.  Left Ear: External ear normal.  Mouth/Throat: Oropharynx is clear and moist. No oropharyngeal exudate.       Missing multiple teeth on both top and bottom, gums are discolored and black, no erythema or bleeding.  No pus drainage.  Multiple dental caries present.  Neck: Normal range of motion. Neck supple.  Lymphadenopathy:    She has no cervical adenopathy.          Assessment & Plan:

## 2011-04-17 NOTE — Patient Instructions (Signed)
It was good to see you again. Please cut back cigarettes..try quitting completely! Go to your pharmacy and pick up a Women's MULTI-VITAMIN and take one tablet daily. Start Ibuprofen 800 mg 1 tablet every 8 hours as needed for pain x 7 days. Start antibiotic Clindamycin 300 mg 1 tablet every 8 hours x 7 days. The dental office will call you with time and date of your appointment. If dental pain becomes worse or if you develop fever, chills, nausea/vomiting - please call MD or go to ER.  Gum Disease Gum disease is an infection of the tissues that surround and support the teeth (periodontium). This includes the gums, connective tissue fibers (ligaments), and the thickened ridges of the tooth bone (sockets). The disease is caused by germs (bacteria) that grow in soft deposits (plaque) on the teeth. This results in redness, soreness, and swelling (inflammation). This inflammation causes the gums to bleed. If left untreated, it can lead to damage of the tissues and supportive bone. Although bacteria are known as the major cause of gum disease, other risk factors include tobacco use, diabetes, certain medications, hormones, pregnancy, and genetic factors. SYMPTOMS   Gums that bleed easily.   Red or swollen gums.   Bad breath that does not go away.   Gums that have pulled away from the teeth.   Loose or separating permanent teeth.   Painful chewing.   Changes in the way your teeth fit together.  DIAGNOSIS  A thorough exam will be performed by a dentist to determine the presence and stage of gum disease. The stage is how far the gum disease has developed. TREATMENT  Treatment is based on the stages of gum disease. The stages include:  Mild. If it is caught early, conditions can improve by brushing and flossing properly.   Moderate. You may need special cleaning (scaling and root planing). This method removes plaque and hardened plaque (tartar) above and below the gum line. Medication may also  be used to treat moderate gum disease.   Severe. This stage requires surgery of the gums and supporting bone.  PREVENTION  You can prevent gum disease by:  Practicing good oral hygiene, including brushing and flossing properly.   Avoiding use of tobacco products.   Scheduling regular dental check-ups and cleanings.   Eating a well-balanced diet.  SEEK IMMEDIATE DENTAL OR MEDICAL CARE IF:  You have fever over 102 F (38.9 C).   You have swelling of your face, neck, or jaw.   You are unable to open your mouth.   You have severe pain not controlled by pain medicine.  Document Released: 06/13/2009 Document Revised: 12/13/2010 Document Reviewed: 06/13/2009 [facility] Patient Information 2012 [place], [place].

## 2011-04-26 ENCOUNTER — Telehealth: Payer: Self-pay | Admitting: *Deleted

## 2011-04-26 NOTE — Telephone Encounter (Signed)
Received fax from [facility]

## 2011-06-10 ENCOUNTER — Telehealth: Payer: Self-pay | Admitting: Cardiology

## 2011-06-10 NOTE — Telephone Encounter (Signed)
New  Problem:  Lunch from 12-1pm. Per Asher Muir sent over medical clearance over on 5/22. Pt has appt on 6/4 @ 4:30 pm.

## 2011-06-10 NOTE — Telephone Encounter (Signed)
This was done by Bing Neighbors today

## 2011-07-22 ENCOUNTER — Ambulatory Visit: Payer: Self-pay | Admitting: Physician Assistant

## 2012-02-10 ENCOUNTER — Emergency Department (HOSPITAL_COMMUNITY)
Admission: EM | Admit: 2012-02-10 | Discharge: 2012-02-10 | Disposition: A | Discharge status: Home / Self Care | Payer: Self-pay | Attending: Emergency Medicine | Admitting: Emergency Medicine

## 2012-02-10 ENCOUNTER — Emergency Department (HOSPITAL_COMMUNITY): Payer: Self-pay

## 2012-02-10 ENCOUNTER — Encounter (HOSPITAL_COMMUNITY): Payer: Self-pay | Admitting: Emergency Medicine

## 2012-02-10 DIAGNOSIS — H5440 Blindness, one eye, unspecified eye: Secondary | ICD-10-CM | POA: Insufficient documentation

## 2012-02-10 DIAGNOSIS — Z7982 Long term (current) use of aspirin: Secondary | ICD-10-CM | POA: Insufficient documentation

## 2012-02-10 DIAGNOSIS — I10 Essential (primary) hypertension: Secondary | ICD-10-CM

## 2012-02-10 DIAGNOSIS — Z8673 Personal history of transient ischemic attack (TIA), and cerebral infarction without residual deficits: Secondary | ICD-10-CM | POA: Insufficient documentation

## 2012-02-10 DIAGNOSIS — Z79899 Other long term (current) drug therapy: Secondary | ICD-10-CM | POA: Insufficient documentation

## 2012-02-10 DIAGNOSIS — R42 Dizziness and giddiness: Secondary | ICD-10-CM | POA: Insufficient documentation

## 2012-02-10 DIAGNOSIS — Z8659 Personal history of other mental and behavioral disorders: Secondary | ICD-10-CM | POA: Insufficient documentation

## 2012-02-10 DIAGNOSIS — H539 Unspecified visual disturbance: Secondary | ICD-10-CM | POA: Insufficient documentation

## 2012-02-10 DIAGNOSIS — E785 Hyperlipidemia, unspecified: Secondary | ICD-10-CM | POA: Insufficient documentation

## 2012-02-10 DIAGNOSIS — R51 Headache: Secondary | ICD-10-CM | POA: Insufficient documentation

## 2012-02-10 DIAGNOSIS — I160 Hypertensive urgency: Secondary | ICD-10-CM

## 2012-02-10 DIAGNOSIS — F17200 Nicotine dependence, unspecified, uncomplicated: Secondary | ICD-10-CM | POA: Insufficient documentation

## 2012-02-10 DIAGNOSIS — I252 Old myocardial infarction: Secondary | ICD-10-CM | POA: Insufficient documentation

## 2012-02-10 LAB — URINALYSIS, ROUTINE W REFLEX MICROSCOPIC
Glucose, UA: NEGATIVE mg/dL
Specific Gravity, Urine: 1.011 (ref 1.005–1.030)
Urobilinogen, UA: 0.2 mg/dL (ref 0.0–1.0)
pH: 6 (ref 5.0–8.0)

## 2012-02-10 LAB — BASIC METABOLIC PANEL
CO2: 25 mEq/L (ref 19–32)
Glucose, Bld: 90 mg/dL (ref 70–99)
Potassium: 3.3 mEq/L — ABNORMAL LOW (ref 3.5–5.1)
Sodium: 142 mEq/L (ref 135–145)

## 2012-02-10 LAB — URINE MICROSCOPIC-ADD ON

## 2012-02-10 LAB — CBC WITH DIFFERENTIAL/PLATELET
Eosinophils Relative: 3 % (ref 0–5)
Hemoglobin: 11.7 g/dL — ABNORMAL LOW (ref 12.0–15.0)
Lymphocytes Relative: 40 % (ref 12–46)
Lymphs Abs: 2.9 10*3/uL (ref 0.7–4.0)
MCV: 77.3 fL — ABNORMAL LOW (ref 78.0–100.0)
Platelets: 275 10*3/uL (ref 150–400)
RBC: 4.58 MIL/uL (ref 3.87–5.11)
WBC: 7.2 10*3/uL (ref 4.0–10.5)

## 2012-02-10 LAB — TROPONIN I: Troponin I: <0.3 ng/mL (ref <0.30)

## 2012-02-10 MED ORDER — METOPROLOL TARTRATE 25 MG PO TABS
50.0000 mg | ORAL_TABLET | Freq: Once | ORAL | Status: AC
  Administered 2012-02-10: 50 mg via ORAL
  Filled 2012-02-10: qty 2

## 2012-02-10 MED ORDER — AMLODIPINE BESYLATE 10 MG PO TABS
10.0000 mg | ORAL_TABLET | Freq: Every day | ORAL | Status: DC
  Administered 2012-02-10: 10 mg via ORAL
  Filled 2012-02-10 (×2): qty 1

## 2012-02-10 MED ORDER — AMLODIPINE BESYLATE 10 MG PO TABS: 10.0000 mg | ORAL_TABLET | Freq: Every day | ORAL | Status: DC

## 2012-02-10 MED ORDER — METOPROLOL TARTRATE 25 MG PO TABS: 50.0000 mg | ORAL_TABLET | Freq: Two times a day (BID) | ORAL | Status: DC

## 2012-02-10 MED ORDER — METOPROLOL TARTRATE 50 MG PO TABS: 50.0000 mg | ORAL_TABLET | Freq: Two times a day (BID) | ORAL | Status: DC

## 2012-02-10 MED ORDER — ACETAMINOPHEN 325 MG PO TABS
650.0000 mg | ORAL_TABLET | Freq: Once | ORAL | Status: AC
  Administered 2012-02-10: 650 mg via ORAL
  Filled 2012-02-10: qty 2

## 2012-02-10 NOTE — ED Provider Notes (Signed)
History     CSN: [id number]  Arrival date & time 02/10/12  1626   First MD Initiated Contact with Patient 02/10/12 1815      Chief Complaint  Patient presents with  . Hypertension    (Consider location/radiation/quality/duration/timing/severity/associated sxs/prior treatment) HPI Comments: 52 year old female with a history of hypertension, NSTEMI, hyperlipidemia and stroke presents to the emergency department from [facility] with high blood pressure. Patient states the doctors were during the normal rounding this morning when he told her that her blood pressure was high and she should go to the emergency department. Blood pressure upon arrival was 194/120. Once she was in the room and evaluated her blood pressure was 163/102. States she's been getting frontal headaches on and off for the past 3 days along with occasional blurriness in her right eye. She is blind in her left eye. Denies chest pain, shortness of breath, leg swelling, nausea, vomiting or diaphoresis. She has not been on her blood pressure medications since last May 2 to insurance reasons. She was on metoprolol and amlodipine.  The history is provided by the patient and a relative.    Past Medical History  Diagnosis Date  . Hypertension   . 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  . NSTEMI (non-ST elevated myocardial infarction)     ? type 2 from HTN urgency 10/2010: LHC with normal cors, EF 55-60%; chest CTA neg for pulmonary emboli  . HLD (hyperlipidemia)   . Anxiety   . Stroke     Past Surgical History  Procedure Date  . Tubal ligation     Family History  Problem Relation Age of Onset  . Coronary artery disease Mother   . Diabetes Mother 62  . Diabetes type II Mother   . Heart attack Mother 80    MI  . Depression Mother   . Hyperlipidemia Mother   . Hypertension Mother   . Stroke Mother     History  Substance Use Topics  . Smoking status: Current Every Day Smoker -- 0.5 packs/day    Types:  Cigarettes  . Smokeless tobacco: Not on file  . Alcohol Use: Not on file    OB History    Grav Para Term Preterm Abortions TAB SAB Ect Mult Living                  Review of Systems  Eyes: Positive for visual disturbance.  Respiratory: Negative for shortness of breath.   Cardiovascular: Negative for chest pain.  Gastrointestinal: Negative for nausea and vomiting.  Neurological: Positive for headaches.  All other systems reviewed and are negative.    Allergies  Penicillins  Home Medications   Current Outpatient Rx  Name  Route  Sig  Dispense  Refill  . AMLODIPINE BESYLATE 5 MG PO TABS   Oral   Take 2 tablets (10 mg total) by mouth daily.   60 tablet   11   . ASPIRIN 81 MG PO TABS   Oral   Take 81 mg by mouth daily.           . IBUPROFEN 200 MG PO TABS   Oral   Take 200 mg by mouth every 6 (six) hours as needed. For pain         . METOPROLOL TARTRATE 50 MG PO TABS   Oral   Take 50 mg by mouth 2 (two) times daily.           Marland Kitchen PRAVASTATIN SODIUM  40 MG PO TABS   Oral   Take 1 tablet (40 mg total) by mouth every evening.   30 tablet   11   . BENZONATATE 100 MG PO CAPS   Oral   Take 1 capsule (100 mg total) by mouth 2 (two) times daily as needed for cough.   20 capsule   0     BP 194/120  Pulse 80  Temp 98.2 F (36.8 C) (Oral)  Resp 20  SpO2 96%  Physical Exam  Nursing note and vitals reviewed. Constitutional: She is oriented to person, place, and time. She appears well-developed and well-nourished. No distress.  HENT:  Head: Normocephalic and atraumatic.  Mouth/Throat: Oropharynx is clear and moist.  Eyes: Conjunctivae normal are normal.  Neck: Normal range of motion. Neck supple. No JVD present.  Cardiovascular: Normal rate, regular rhythm, normal heart sounds and intact distal pulses.        No extremity edema.  Pulmonary/Chest: Effort normal and breath sounds normal. No respiratory distress. She has no wheezes. She has no rales.   Abdominal: Soft. Bowel sounds are normal. There is no tenderness.  Musculoskeletal: Normal range of motion. She exhibits no edema.  Neurological: She is alert and oriented to person, place, and time.  Skin: Skin is warm and dry.  Psychiatric: She has a normal mood and affect. Her behavior is normal.    ED Course  Procedures (including critical care time)  Labs Reviewed  URINALYSIS, ROUTINE W REFLEX MICROSCOPIC - Abnormal; Notable for the following:    Hgb urine dipstick SMALL (*)     Leukocytes, UA TRACE (*)     All other components within normal limits  CBC WITH DIFFERENTIAL - Abnormal; Notable for the following:    Hemoglobin 11.7 (*)     HCT 35.4 (*)     MCV 77.3 (*)     MCH 25.5 (*)     RDW 15.8 (*)     All other components within normal limits  BASIC METABOLIC PANEL - Abnormal; Notable for the following:    Potassium 3.3 (*)     All other components within normal limits  URINE MICROSCOPIC-ADD ON - Abnormal; Notable for the following:    Squamous Epithelial / LPF MANY (*)     Bacteria, UA FEW (*)     All other components within normal limits  TROPONIN I    Date: 02/10/2012  Rate: 73  Rhythm: normal sinus rhythm  QRS Axis: normal  Intervals: normal  ST/T Wave abnormalities: normal  Conduction Disutrbances:none  Narrative Interpretation: normal EKG  Old EKG Reviewed: unchanged   Ct Head Wo Contrast  02/10/2012  *RADIOLOGY REPORT*  Clinical Data: Hypertension.  Headache.  CT HEAD WITHOUT CONTRAST  Technique:  Contiguous axial images were obtained from the base of the skull through the vertex without contrast.  Comparison: 04/27/2008  Findings: Small remote lacunar infarct in the head of the left caudate nucleus noted.  Otherwise, the brain stem, cerebellum, cerebral peduncles, thalami, basal ganglia, basilar cisterns, and ventricular system appear unremarkable.  No intracranial hemorrhage, mass lesion, or acute infarction is identified.  Dense calcification noted in the  posterior chamber of the left orbit.  This is chronic.  There is left mastoid effusion.  IMPRESSION:  1.  Left mastoid effusion. 2.  Chronic calcification in the left globe. 3.  Small remote lacunar infarct in the head of the left caudate nucleus.  No acute intracranial findings.   Original Report Authenticated By: Gaylyn Rong,  M.D.      1. Hypertension   2. Hypertensive urgency       MDM  52 y/o female with hypertensive urgency. Blood pressure decreased from triage to exam room. Labs normal. Troponin negative. EKG normal. CT head obtained per Dr. Manus Gunning due to headache from HTN which was negative for any acute abnormality. She is non-compliant with her home medications. Lopressor and amlodipine given. Blood pressure 142/100. Lopressor and amlodipine prescriptions given. Discussed importance of medication compliance. She will f/u with her PCP this week. Return precautions discussed in detail with patient and husband who state their understanding of plan and are agreeable. Patient stable for discharge. Case discussed with Dr. Manus Gunning who agrees with plan of care.        Trevor Mace, PA-C 02/11/12 [phone]

## 2012-02-10 NOTE — ED Notes (Signed)
Pt c/o htn x several days; pt sts intermittent HA but denies at present; pt sts not taking meds; pt sts some dizziness at times

## 2012-02-11 NOTE — ED Provider Notes (Signed)
Medical screening examination/treatment/procedure(s) were performed by non-physician practitioner and as supervising physician I was immediately available for consultation/collaboration.  Glynn Octave, MD 02/11/12 [phone]

## 2012-02-13 ENCOUNTER — Encounter: Payer: Self-pay | Admitting: Family Medicine

## 2012-02-13 ENCOUNTER — Ambulatory Visit (INDEPENDENT_AMBULATORY_CARE_PROVIDER_SITE_OTHER): Payer: Self-pay | Admitting: Family Medicine

## 2012-02-13 VITALS — BP 156/105 | HR 80 | Temp 98.6°F | Ht 66.0 in | Wt 156.0 lb

## 2012-02-13 DIAGNOSIS — E785 Hyperlipidemia, unspecified: Secondary | ICD-10-CM

## 2012-02-13 DIAGNOSIS — I10 Essential (primary) hypertension: Secondary | ICD-10-CM

## 2012-02-13 DIAGNOSIS — I214 Non-ST elevation (NSTEMI) myocardial infarction: Secondary | ICD-10-CM

## 2012-02-13 DIAGNOSIS — M25562 Pain in left knee: Secondary | ICD-10-CM

## 2012-02-13 DIAGNOSIS — M25569 Pain in unspecified knee: Secondary | ICD-10-CM

## 2012-02-13 MED ORDER — PRAVASTATIN SODIUM 40 MG PO TABS: 40.0000 mg | ORAL_TABLET | Freq: Every evening | ORAL | Status: DC

## 2012-02-13 MED ORDER — LISINOPRIL 10 MG PO TABS: 10.0000 mg | ORAL_TABLET | Freq: Every day | ORAL | Status: DC

## 2012-02-13 NOTE — Patient Instructions (Addendum)
Continue to take Metoprolol twice per day and Amlodipine daily. Start taking Lisinopril 10 mg daily for high blood pressure. If you can afford Pravastatin, please take that daily too. Remember to take a daily Aspirin! Schedule follow up appointment with me in 4-8 weeks or sooner as needed.  DASH Diet The DASH diet stands for "Dietary Approaches to Stop Hypertension." It is a healthy eating plan that has been shown to reduce high blood pressure (hypertension) in as little as 14 days, while also possibly providing other significant health benefits. These other health benefits include reducing the risk of breast cancer after menopause and reducing the risk of type 2 diabetes, heart disease, colon cancer, and stroke. Health benefits also include weight loss and slowing kidney failure in patients with chronic kidney disease.  DIET GUIDELINES  Limit salt (sodium). Your diet should contain less than 1500 mg of sodium daily.  Limit refined or processed carbohydrates. Your diet should include mostly whole grains. Desserts and added sugars should be used sparingly.  Include small amounts of heart-healthy fats. These types of fats include nuts, oils, and tub margarine. Limit saturated and trans fats. These fats have been shown to be harmful in the body. CHOOSING FOODS  The following food groups are based on a 2000 calorie diet. See your Registered Dietitian for individual calorie needs. Grains and Grain Products (6 to 8 servings daily)  Eat More Often: Whole-wheat bread, brown rice, whole-grain or wheat pasta, quinoa, popcorn without added fat or salt (air popped).  Eat Less Often: White bread, white pasta, white rice, cornbread. Vegetables (4 to 5 servings daily)  Eat More Often: Fresh, frozen, and canned vegetables. Vegetables may be raw, steamed, roasted, or grilled with a minimal amount of fat.  Eat Less Often/Avoid: Creamed or fried vegetables. Vegetables in a cheese sauce. Fruit (4 to 5  servings daily)  Eat More Often: All fresh, canned (in natural juice), or frozen fruits. Dried fruits without added sugar. One hundred percent fruit juice ( cup [237 mL] daily).  Eat Less Often: Dried fruits with added sugar. Canned fruit in light or heavy syrup. [employer], Fish, and Poultry (2 servings or less daily. One serving is 3 to 4 oz [85-114 g]).  Eat More Often: Ninety percent or leaner ground beef, tenderloin, sirloin. Round cuts of beef, chicken breast, [place] breast. All fish. Grill, bake, or broil your meat. Nothing should be fried.  Eat Less Often/Avoid: Fatty cuts of meat, [place], or chicken leg, thigh, or wing. Fried cuts of meat or fish. Dairy (2 to 3 servings)  Eat More Often: Low-fat or fat-free milk, low-fat plain or light yogurt, reduced-fat or part-skim cheese.  Eat Less Often/Avoid: Milk (whole, 2%).Whole milk yogurt. Full-fat cheeses. Nuts, Seeds, and Legumes (4 to 5 servings per week)  Eat More Often: All without added salt.  Eat Less Often/Avoid: Salted nuts and seeds, canned beans with added salt. Fats and Sweets (limited)  Eat More Often: Vegetable oils, tub margarines without trans fats, sugar-free gelatin. Mayonnaise and salad dressings.  Eat Less Often/Avoid: Coconut oils, palm oils, butter, stick margarine, cream, half and half, cookies, candy, pie. FOR MORE INFORMATION The Dash Diet Eating Plan: www.dashdiet.org Document Released: 12/13/2010 Document Revised: 03/18/2011 Document Reviewed: 12/13/2010 [facility] Patient Information 2013 [place], [place].
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
## 2012-02-14 ENCOUNTER — Encounter: Payer: Self-pay | Admitting: Family Medicine

## 2012-02-14 DIAGNOSIS — M25562 Pain in left knee: Secondary | ICD-10-CM | POA: Insufficient documentation

## 2012-02-14 NOTE — Assessment & Plan Note (Signed)
Encouraged patient to take daily ASA and to pick up Pravastatin if she can afford it.

## 2012-02-14 NOTE — Assessment & Plan Note (Signed)
BP elevated today 156/105.  Will add Lisinopril 10 mg daily.  Continue Metoprolol and Amlodipine.  Counseled on DASH diet.

## 2012-02-14 NOTE — Assessment & Plan Note (Signed)
Treat conservatively initially with Tylenol and Bengay rubs.  Return to clinic as needed if pain worsens.

## 2012-08-21 ENCOUNTER — Ambulatory Visit: Payer: Self-pay

## 2012-09-02 ENCOUNTER — Encounter: Payer: Self-pay | Admitting: Family Medicine

## 2012-09-02 ENCOUNTER — Ambulatory Visit (HOSPITAL_COMMUNITY)
Admission: RE | Admit: 2012-09-02 | Discharge: 2012-09-02 | Disposition: A | Discharge status: Home / Self Care | Payer: No Typology Code available for payment source | Source: Ambulatory Visit | Attending: Family Medicine | Admitting: Family Medicine

## 2012-09-02 ENCOUNTER — Ambulatory Visit (INDEPENDENT_AMBULATORY_CARE_PROVIDER_SITE_OTHER): Payer: No Typology Code available for payment source | Admitting: Family Medicine

## 2012-09-02 VITALS — BP 151/108 | HR 96 | Temp 99.1°F | Ht 66.0 in | Wt 151.0 lb

## 2012-09-02 DIAGNOSIS — M25561 Pain in right knee: Secondary | ICD-10-CM | POA: Insufficient documentation

## 2012-09-02 DIAGNOSIS — M25569 Pain in unspecified knee: Secondary | ICD-10-CM

## 2012-09-02 DIAGNOSIS — M1710 Unilateral primary osteoarthritis, unspecified knee: Secondary | ICD-10-CM | POA: Insufficient documentation

## 2012-09-02 MED ORDER — METOPROLOL TARTRATE 50 MG PO TABS: 50.0000 mg | ORAL_TABLET | Freq: Two times a day (BID) | ORAL | Status: DC

## 2012-09-02 MED ORDER — IBUPROFEN 200 MG PO TABS: 400.0000 mg | ORAL_TABLET | Freq: Four times a day (QID) | ORAL | Status: DC | PRN

## 2012-09-02 NOTE — Patient Instructions (Signed)
Specific handouts given for quad strength and knee extension/hip abduction.

## 2012-09-02 NOTE — Assessment & Plan Note (Addendum)
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  Patellar crepitus palpated with leg flexion/extension and Sx consistent as pain w/ rising from chairs and movement up/down stairs.  Will get standing AP B/L films, sunrise, lateral, and flexion and see back in two weeks.  Exercises for hip abduction strength, quad strength, and knee extension.  For pain, recommend ibuprofen OTC or can give Rx mobic if wants something stronger.  Consider injection in future if true Chondromalacia patella

## 2012-09-02 NOTE — Progress Notes (Signed)
Natalie Bishop is a 52 y.o. female who presents to clinic today for B/L knee pain.  Pt states she has had anterior knee pain, on both knees, for the past 4-5 months.  No trauma or injury at that time and no previous injury to either knees.  She denies any swelling, effusions of the knee, fever, chills, locking, catching, or actually giving way.  She does describe some instability with walking, especially after sitting for a prolonged period of time.  Pain is worse with arising from a chair, up/down stairs, or prolonged mobilization.  Pain improved with rest and sometimes ice to the area.  Dull achy pain in the front of the knee w/o radiation and denies any pain with squatting.  Pain is worse overall on the L than the R.    PMH reviewed.  History  Substance Use Topics  . Smoking status: Current Every Day Smoker -- 0.50 packs/day    Types: Cigarettes  . Smokeless tobacco: Not on file  . Alcohol Use: Not on file   ROS as above otherwise neg   Exam:  BP 151/108  Pulse 96  Temp(Src) 99.1 F (37.3 C) (Oral)  Ht 5\' 6"  (1.676 m)  Wt 151 lb (68.493 kg)  BMI 24.38 kg/m2 Gen: Well NAD Cardio: RRR, No M/G/R Pulm: CTAB Knee:  Exam: General (compare with less affected knee)  1. Observation - No ecchymosis, effusion.  Small quadriceps B/L with medial smaller > lateral  2. Tenderness to Palpation - TTP on patella, no TTP on tibial tubercle, patellar tendon, quad tendon, medial/lateral joint line.  + crepitus w/ knee flexion/extension B/L, + grind test on L  3. Normal Range of Motion - F - 135 degrees B/L, Ex - 0 B/L Patellofemoral - Patellar mobility nml B/L, neg apprehension B/L  Anterior Cruciate Ligament (ACL) Stability Tests - neg Lachman/Anterior drawer B/L Posterior Cruciate Ligament (PCL) Tests - Neg B/L Collateral ligament evaluation - Neg B/L  Meniscus Evaluation - Neg McMurray's and joint line tenderness B/L  Standing evaluation - Q angle > 10 degrees, + antalgic gait L leg Neurovascular  Status - Intact B/L LE

## 2012-09-17 ENCOUNTER — Ambulatory Visit: Payer: No Typology Code available for payment source | Admitting: Family Medicine

## 2012-10-07 ENCOUNTER — Ambulatory Visit: Payer: No Typology Code available for payment source | Admitting: Family Medicine

## 2013-02-10 ENCOUNTER — Ambulatory Visit: Payer: Self-pay

## 2013-03-23 ENCOUNTER — Ambulatory Visit: Payer: Self-pay | Admitting: Family Medicine

## 2013-08-06 ENCOUNTER — Other Ambulatory Visit: Payer: Self-pay | Admitting: Family Medicine

## 2013-08-06 ENCOUNTER — Ambulatory Visit (INDEPENDENT_AMBULATORY_CARE_PROVIDER_SITE_OTHER): Payer: Medicaid Other | Admitting: Family Medicine

## 2013-08-06 ENCOUNTER — Encounter: Payer: Self-pay | Admitting: Family Medicine

## 2013-08-06 VITALS — BP 131/100 | HR 116 | Temp 99.0°F | Ht 66.0 in | Wt 150.0 lb

## 2013-08-06 DIAGNOSIS — I10 Essential (primary) hypertension: Secondary | ICD-10-CM

## 2013-08-06 DIAGNOSIS — I214 Non-ST elevation (NSTEMI) myocardial infarction: Secondary | ICD-10-CM

## 2013-08-06 DIAGNOSIS — I252 Old myocardial infarction: Secondary | ICD-10-CM | POA: Insufficient documentation

## 2013-08-06 LAB — CBC
HEMATOCRIT: 37.2 % (ref 36.0–46.0)
HEMOGLOBIN: 12.6 g/dL (ref 12.0–15.0)
MCH: 25.5 pg — ABNORMAL LOW (ref 26.0–34.0)
MCHC: 33.9 g/dL (ref 30.0–36.0)
MCV: 75.2 fL — ABNORMAL LOW (ref 78.0–100.0)
Platelets: 312 10*3/uL (ref 150–400)
RBC: 4.95 MIL/uL (ref 3.87–5.11)
RDW: 15.3 % (ref 11.5–15.5)
WBC: 8.5 10*3/uL (ref 4.0–10.5)

## 2013-08-06 LAB — LIPID PANEL
CHOLESTEROL: 231 mg/dL — AB (ref 0–200)
HDL: 39 mg/dL — AB (ref >39)
LDL CALC: 140 mg/dL — AB (ref 0–99)
TRIGLYCERIDES: 259 mg/dL — AB (ref <150)
Total CHOL/HDL Ratio: 5.9 Ratio
VLDL: 52 mg/dL — ABNORMAL HIGH (ref 0–40)

## 2013-08-06 LAB — COMPREHENSIVE METABOLIC PANEL
ALBUMIN: 4.9 g/dL (ref 3.5–5.2)
ALT: <8 U/L (ref 0–35)
AST: 11 U/L (ref 0–37)
Alkaline Phosphatase: 53 U/L (ref 39–117)
BUN: 12 mg/dL (ref 6–23)
CALCIUM: 10.3 mg/dL (ref 8.4–10.5)
CHLORIDE: 106 meq/L (ref 96–112)
CO2: 28 meq/L (ref 19–32)
CREATININE: 0.77 mg/dL (ref 0.50–1.10)
GLUCOSE: 86 mg/dL (ref 70–99)
Potassium: 3.6 mEq/L (ref 3.5–5.3)
Sodium: 144 mEq/L (ref 135–145)
Total Bilirubin: 0.5 mg/dL (ref 0.2–1.2)
Total Protein: 7.9 g/dL (ref 6.0–8.3)

## 2013-08-06 LAB — POCT GLYCOSYLATED HEMOGLOBIN (HGB A1C): Hemoglobin A1C: 6

## 2013-08-06 MED ORDER — METOPROLOL TARTRATE 50 MG PO TABS: 50.0000 mg | ORAL_TABLET | Freq: Two times a day (BID) | ORAL | Status: DC

## 2013-08-06 MED ORDER — ASPIRIN 81 MG PO TABS: 81.0000 mg | ORAL_TABLET | Freq: Every day | ORAL | Status: DC

## 2013-08-06 MED ORDER — ATORVASTATIN CALCIUM 40 MG PO TABS: 40.0000 mg | ORAL_TABLET | Freq: Every day | ORAL | Status: DC

## 2013-08-06 MED ORDER — HYDROCHLOROTHIAZIDE 12.5 MG PO CAPS: 12.5000 mg | ORAL_CAPSULE | Freq: Every day | ORAL | Status: DC

## 2013-08-06 NOTE — Patient Instructions (Signed)
Ms. Nardone, please start taking an 81 mg of Aspirin daily, start taking the hydrochlorathiazide 12.5 mg daily, Metoprolol 50 mg daily, and lipitor 40 mg daily.  We will see you back in 6 weeks.  Thanks,  Dr. Awanda Mink

## 2013-08-06 NOTE — Progress Notes (Signed)
Natalie Bishop is a 53 y.o. female who presents today for elevated BP.  HTN - Pt initial pressure elevated to 129/107 and repeat elevated to 138/106.  She has been out of medications for the past 6 months and states she has felt "funny over the past month".  She denies any CP/SOB/blurred vision/HA/hematuria, abdominal pain.  Stopped taking her medications due to cost.  Hx of NSTEMI - Has not been taking her ASA or her Metoprolol, denies any CP or SOB.   HLD - not taking her Pravachol.   Past Medical History  Diagnosis Date  . Hypertension   . 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  . NSTEMI (non-ST elevated myocardial infarction)     ? type 2 from HTN urgency 10/2010: LHC with normal cors, EF 55-60%; chest CTA neg for pulmonary emboli  . HLD (hyperlipidemia)   . Anxiety   . Stroke     History  Smoking status  . Current Every Day Smoker -- 0.50 packs/day  . Types: Cigarettes  Smokeless tobacco  . Not on file    Family History  Problem Relation Age of Onset  . Coronary artery disease Mother   . Diabetes Mother 32  . Diabetes type II Mother   . Heart attack Mother 60    MI  . Depression Mother   . Hyperlipidemia Mother   . Hypertension Mother   . Stroke Mother     Current Outpatient Prescriptions on File Prior to Visit  Medication Sig Dispense Refill  . ibuprofen (ADVIL,MOTRIN) 200 MG tablet Take 2 tablets (400 mg total) by mouth every 6 (six) hours as needed. For pain  1 tablet  0   No current facility-administered medications on file prior to visit.    ROS: Per HPI.  All other systems reviewed and are negative.   Physical Exam Filed Vitals:   08/06/13 1507  BP: 131/100  Pulse: 116  Temp:     Physical Examination: General appearance - alert, well appearing, and in no distress Neck - No JVD Chest - clear to auscultation, no wheezes, rales or rhonchi, symmetric air entry Heart - normal rate and regular rhythm, no murmurs noted    Chemistry       Component Value Date/Time   NA 142 02/10/2012 1657   K 3.3* 02/10/2012 1657   CL 106 02/10/2012 1657   CO2 25 02/10/2012 1657   BUN 14 02/10/2012 1657   CREATININE 0.68 02/10/2012 1657      Component Value Date/Time   CALCIUM 9.9 02/10/2012 1657   ALKPHOS 54 01/21/2011 1008   AST 16 01/21/2011 1008   ALT 8 01/21/2011 1008   BILITOT 0.4 01/21/2011 1008      Lab Results  Component Value Date   WBC 7.2 02/10/2012   HGB 11.7* 02/10/2012   HCT 35.4* 02/10/2012   MCV 77.3* 02/10/2012   PLT 275 02/10/2012   Lab Results  Component Value Date   TSH 1.038 10/30/2010   No results found for this basename: HGBA1C

## 2013-08-06 NOTE — Assessment & Plan Note (Signed)
Recommend restarting Metoprolol, ASA, and Lipitor 40 mg along with obtaining Lipid Panel, A1C today.

## 2013-08-06 NOTE — Assessment & Plan Note (Signed)
BP elevated to 140/106 on repeat, she has been out of her medications for over 6 months now.  At this point, will obtain CMET, Lipid Panel, CBC, A1C for her and start on HCTZ 12.5 mg (AA w/o evidence of CKD or DM) and restart Metoprolol 50 mg BID 2/2 her previous NSTEMI Hx.  Could not afford Norvasc due to cost.

## 2013-08-07 LAB — MICROALBUMIN / CREATININE URINE RATIO
CREATININE, URINE: 232 mg/dL
MICROALB UR: 22.18 mg/dL — AB (ref 0.00–1.89)
MICROALB/CREAT RATIO: 95.6 mg/g — AB (ref 0.0–30.0)

## 2013-09-16 ENCOUNTER — Ambulatory Visit (INDEPENDENT_AMBULATORY_CARE_PROVIDER_SITE_OTHER): Payer: Medicaid Other | Admitting: Family Medicine

## 2013-09-16 ENCOUNTER — Encounter: Payer: Self-pay | Admitting: Family Medicine

## 2013-09-16 VITALS — BP 117/82 | HR 72 | Temp 98.4°F | Ht 66.0 in | Wt 150.0 lb

## 2013-09-16 DIAGNOSIS — F17200 Nicotine dependence, unspecified, uncomplicated: Secondary | ICD-10-CM

## 2013-09-16 DIAGNOSIS — I10 Essential (primary) hypertension: Secondary | ICD-10-CM

## 2013-09-16 DIAGNOSIS — R911 Solitary pulmonary nodule: Secondary | ICD-10-CM

## 2013-09-16 DIAGNOSIS — E785 Hyperlipidemia, unspecified: Secondary | ICD-10-CM

## 2013-09-16 DIAGNOSIS — Z720 Tobacco use: Secondary | ICD-10-CM

## 2013-09-16 MED ORDER — LISINOPRIL-HYDROCHLOROTHIAZIDE 10-12.5 MG PO TABS: 1.0000 | ORAL_TABLET | Freq: Every day | ORAL | Status: DC

## 2013-09-16 NOTE — Patient Instructions (Signed)
Please take the zestoretic 10/12.5 mg tablet once per day. We will see you back in 6 weeks. You can stop taking the HCTZ medication for your blood pressure.  Please schedule an appointment with Dr. Valentina Lucks for smoking cessation on the way out today.   Thanks, Dr. Awanda Mink

## 2013-09-16 NOTE — Assessment & Plan Note (Signed)
Micro:Creatinine ratio at 95.5, concerning for renal involvement of her HTN.  Will start Zestoretic 10/12.5 and f/u in 6 weeks, considering BMET at that time.

## 2013-09-16 NOTE — Assessment & Plan Note (Signed)
CTA from 2012 showing lung nodules and she has not had f/u.  Will obtain CT chest w/o contrast to evaluate for growth of these nodules.

## 2013-09-16 NOTE — Progress Notes (Signed)
Natalie Bishop is a 53 y.o. female who presents today for elevated BP.  HTN - Pt pressure well controlled today to 117/82.  Compliant with HCTZ.  She denies any CP/SOB/blurred vision/HA/hematuria, abdominal pain.    Hx of NSTEMI - Taking metoprolol and ASA since last visit, denies any CP or SOB.   HLD - On Lipitor 40 mg, doing well w/o trouble  Tobacco Abuse - Smoking 1.5 ppd, has been doing for "yrs", willing to quit.    Past Medical History  Diagnosis Date  . Hypertension   . 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  . NSTEMI (non-ST elevated myocardial infarction)     ? type 2 from HTN urgency 10/2010: LHC with normal cors, EF 55-60%; chest CTA neg for pulmonary emboli  . HLD (hyperlipidemia)   . Anxiety   . Stroke     History  Smoking status  . Current Every Day Smoker -- 0.50 packs/day  . Types: Cigarettes  Smokeless tobacco  . Not on file    Family History  Problem Relation Age of Onset  . Coronary artery disease Mother   . Diabetes Mother 74  . Diabetes type II Mother   . Heart attack Mother 55    MI  . Depression Mother   . Hyperlipidemia Mother   . Hypertension Mother   . Stroke Mother     Current Outpatient Prescriptions on File Prior to Visit  Medication Sig Dispense Refill  . aspirin 81 MG tablet Take 1 tablet (81 mg total) by mouth daily.  30 tablet  5  . atorvastatin (LIPITOR) 40 MG tablet Take 1 tablet (40 mg total) by mouth daily.  90 tablet  3  . hydrochlorothiazide (MICROZIDE) 12.5 MG capsule Take 1 capsule (12.5 mg total) by mouth daily.  30 capsule  3  . ibuprofen (ADVIL,MOTRIN) 200 MG tablet Take 2 tablets (400 mg total) by mouth every 6 (six) hours as needed. For pain  1 tablet  0  . metoprolol (LOPRESSOR) 50 MG tablet Take 1 tablet (50 mg total) by mouth 2 (two) times daily.  60 tablet  5   No current facility-administered medications on file prior to visit.    ROS: Per HPI.  All other systems reviewed and are negative.   Physical  Exam Filed Vitals:   09/16/13 1029  BP: 117/82  Pulse: 72  Temp: 98.4 F (36.9 C)    Physical Examination: General appearance - alert, well appearing, and in no distress Neck - No JVD Chest - clear to auscultation, no wheezes, rales or rhonchi, symmetric air entry Heart - normal rate and regular rhythm, no murmurs noted    Chemistry      Component Value Date/Time   NA 144 08/06/2013 1552   K 3.6 08/06/2013 1552   CL 106 08/06/2013 1552   CO2 28 08/06/2013 1552   BUN 12 08/06/2013 1552   CREATININE 0.77 08/06/2013 1552   CREATININE 0.68 02/10/2012 1657      Component Value Date/Time   CALCIUM 10.3 08/06/2013 1552   ALKPHOS 53 08/06/2013 1552   AST 11 08/06/2013 1552   ALT <8 08/06/2013 1552   BILITOT 0.5 08/06/2013 1552      Lab Results  Component Value Date   WBC 8.5 08/06/2013   HGB 12.6 08/06/2013   HCT 37.2 08/06/2013   MCV 75.2* 08/06/2013   PLT 312 08/06/2013   Lab Results  Component Value Date   TSH 1.038 10/30/2010  Lab Results  Component Value Date   HGBA1C 6.0 08/06/2013

## 2013-09-16 NOTE — Assessment & Plan Note (Signed)
Continue Lipitor 40 mg and consider recheck of lipid panel in July 2016.

## 2013-09-16 NOTE — Assessment & Plan Note (Signed)
Refer to Dr. Valentina Lucks for evaluation and tx.

## 2013-09-20 ENCOUNTER — Telehealth: Payer: Self-pay | Admitting: *Deleted

## 2013-09-20 NOTE — Telephone Encounter (Signed)
Appointment for her CT is scheduled for Tuesday 9/15 @ 4:30pm, patient needs to arrive @ 4/15pm. Patient has been notified

## 2013-09-21 ENCOUNTER — Other Ambulatory Visit: Payer: Medicaid Other

## 2013-10-27 ENCOUNTER — Ambulatory Visit: Payer: Medicaid Other | Admitting: Family Medicine

## 2013-11-08 ENCOUNTER — Ambulatory Visit: Payer: Medicaid Other | Admitting: Family Medicine

## 2016-08-27 ENCOUNTER — Encounter (HOSPITAL_COMMUNITY): Payer: Self-pay | Admitting: Emergency Medicine

## 2016-08-27 ENCOUNTER — Emergency Department (HOSPITAL_COMMUNITY): Payer: Medicaid Other

## 2016-08-27 ENCOUNTER — Emergency Department (HOSPITAL_COMMUNITY)
Admission: EM | Admit: 2016-08-27 | Discharge: 2016-08-27 | Disposition: A | Discharge status: Home / Self Care | Payer: Medicaid Other | Attending: Emergency Medicine | Admitting: Emergency Medicine

## 2016-08-27 DIAGNOSIS — Z7982 Long term (current) use of aspirin: Secondary | ICD-10-CM | POA: Insufficient documentation

## 2016-08-27 DIAGNOSIS — F17210 Nicotine dependence, cigarettes, uncomplicated: Secondary | ICD-10-CM | POA: Insufficient documentation

## 2016-08-27 DIAGNOSIS — X58XXXA Exposure to other specified factors, initial encounter: Secondary | ICD-10-CM | POA: Insufficient documentation

## 2016-08-27 DIAGNOSIS — S90822A Blister (nonthermal), left foot, initial encounter: Secondary | ICD-10-CM | POA: Insufficient documentation

## 2016-08-27 DIAGNOSIS — Z9114 Patient's other noncompliance with medication regimen: Secondary | ICD-10-CM | POA: Insufficient documentation

## 2016-08-27 DIAGNOSIS — Z79899 Other long term (current) drug therapy: Secondary | ICD-10-CM | POA: Insufficient documentation

## 2016-08-27 DIAGNOSIS — Y999 Unspecified external cause status: Secondary | ICD-10-CM | POA: Insufficient documentation

## 2016-08-27 DIAGNOSIS — Y939 Activity, unspecified: Secondary | ICD-10-CM | POA: Insufficient documentation

## 2016-08-27 DIAGNOSIS — I10 Essential (primary) hypertension: Secondary | ICD-10-CM | POA: Insufficient documentation

## 2016-08-27 DIAGNOSIS — R2242 Localized swelling, mass and lump, left lower limb: Secondary | ICD-10-CM | POA: Insufficient documentation

## 2016-08-27 DIAGNOSIS — Y929 Unspecified place or not applicable: Secondary | ICD-10-CM | POA: Insufficient documentation

## 2016-08-27 DIAGNOSIS — M79672 Pain in left foot: Secondary | ICD-10-CM

## 2016-08-27 MED ORDER — ACETAMINOPHEN 325 MG PO TABS
650.0000 mg | ORAL_TABLET | Freq: Once | ORAL | Status: AC
  Administered 2016-08-27: 650 mg via ORAL
  Filled 2016-08-27: qty 2

## 2016-08-27 MED ORDER — CLONIDINE HCL 0.1 MG PO TABS
0.1000 mg | ORAL_TABLET | Freq: Once | ORAL | Status: AC
  Administered 2016-08-27: 0.1 mg via ORAL

## 2016-08-27 MED ORDER — CLONIDINE HCL 0.1 MG PO TABS
ORAL_TABLET | ORAL | Status: AC
  Filled 2016-08-27: qty 1

## 2016-08-27 MED ORDER — LIDOCAINE HCL (PF) 1 % IJ SOLN
5.0000 mL | Freq: Once | INTRAMUSCULAR | Status: AC
  Administered 2016-08-27: 5 mL
  Filled 2016-08-27: qty 30

## 2016-08-27 NOTE — Discharge Instructions (Signed)
As discussed, make sure that you wear well-padded shoes and comfortable shoes. Follow-up with your primary care provider and have them recheck it in 48 hours. Tylenol for pain. Keep your bandage on and change it every day keeping it clean. Monitor for any signs of infection, including redness, swelling, purulence, increased pain, fever or any other new concerning symptoms. Return to be seen sooner if experiencing any of these.

## 2016-08-27 NOTE — ED Provider Notes (Signed)
[place] DEPT Provider Note   CSN: [id number] Arrival date & time: 08/27/16  1315     History   Chief Complaint Chief Complaint  Patient presents with  . Foot Pain    left     HPI Natalie Bishop is a 56 y.o. female presenting with 5 days of left heel pain with soft tissue swelling and fluctuance. Patient is unsure if she injured it or if anything punctured her foot. Pain is worse with weightbearing or palpation. She has tried toothpaste and soaks without relief. Denies PMH of diabetes, fever, chills, numbness, weakness.   HPI  Past Medical History:  Diagnosis Date  . Anxiety   . 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  Marland Kitchen HLD (hyperlipidemia)   . Hypertension   . NSTEMI (non-ST elevated myocardial infarction) ([place])    ? type 2 from HTN urgency 10/2010: LHC with normal cors, EF 55-60%; chest CTA neg for pulmonary emboli  . Stroke [facility]     Patient Active Problem List   Diagnosis Date Noted  . History of non-ST elevation myocardial infarction (NSTEMI) 08/06/2013  . Bilateral anterior knee pain 09/02/2012  . Dental decay 04/17/2011  . 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 Tobacco abuse 01/22/2011  . Lung nodule 11/13/2010  . Microcytic anemia 11/13/2010  . Hypertension   . NSTEMI (non-ST elevated myocardial infarction) ([place])   . HLD (hyperlipidemia)     Past Surgical History:  Procedure Laterality Date  . TUBAL LIGATION      OB History    No data available       Home Medications    Prior to Admission medications   Medication Sig Start Date End Date Taking? Authorizing Provider  aspirin 81 MG tablet Take 1 tablet (81 mg total) by mouth daily. 08/06/13   Hess, Tamela Oddi, DO  atorvastatin (LIPITOR) 40 MG tablet Take 1 tablet (40 mg total) by mouth daily. 08/06/13   Hess, Tamela Oddi, DO  ibuprofen (ADVIL,MOTRIN) 200 MG tablet Take 2 tablets (400 mg total) by mouth every 6 (six) hours as needed. For pain 09/02/12    Nolon Rod, DO  lisinopril-hydrochlorothiazide (PRINZIDE,ZESTORETIC) 10-12.5 MG per tablet Take 1 tablet by mouth daily. 09/16/13   Hess, Tamela Oddi, DO  metoprolol (LOPRESSOR) 50 MG tablet Take 1 tablet (50 mg total) by mouth 2 (two) times daily. 08/06/13   Nolon Rod, DO    Family History Family History  Problem Relation Age of Onset  . Coronary artery disease Mother   . Diabetes Mother 62  . Diabetes type II Mother   . Heart attack Mother 76       MI  . Depression Mother   . Hyperlipidemia Mother   . Hypertension Mother   . Stroke Mother     Social History Social History  Substance Use Topics  . Smoking status: Current Every Day Smoker    Packs/day: 0.50    Types: Cigarettes  . Smokeless tobacco: Never Used  . Alcohol use Yes     Allergies   Penicillins   Review of Systems Review of Systems  Constitutional: Negative for chills and fever.  HENT: Negative for hearing loss and tinnitus.   Eyes: Negative for photophobia and visual disturbance.  Respiratory: Negative for chest tightness, shortness of breath and wheezing.   Cardiovascular: Negative for chest pain and palpitations.  Gastrointestinal: Negative for nausea and vomiting.  Musculoskeletal: Positive for arthralgias and  myalgias.  Skin: Positive for color change, pallor and wound. Negative for rash.  Neurological: Negative for dizziness, facial asymmetry, weakness, light-headedness, numbness and headaches.     Physical Exam Updated Vital Signs BP (!) 169/124 (BP Location: Right Arm) Comment: EDPA aware. Patient is asymptomatic.  Patient states she intends to follow up with a PCP for BP control  Pulse 85   Temp 98.3 F (36.8 C) (Oral)   Resp 18   Ht 5\' 5"  (1.651 m)   Wt 61.3 kg (135 lb 2 oz)   SpO2 99%   BMI 22.49 kg/m   Physical Exam  Constitutional: She appears well-developed and well-nourished. No distress.  Afebrile, non-toxic-appearing, sitting comfortable in chair in no acute distress.    HENT:  Head: Normocephalic and atraumatic.  Eyes: Conjunctivae are normal.  Neck: Neck supple.  Cardiovascular: Normal rate, regular rhythm, normal heart sounds and intact distal pulses.   Pulmonary/Chest: Effort normal and breath sounds normal. No respiratory distress. She has no wheezes. She has no rales.  Abdominal: She exhibits no distension.  Musculoskeletal: Normal range of motion. She exhibits edema and tenderness. She exhibits no deformity.  Posterior heel raised area with central fluctuance, blistering and tenderness to palpation. Neurovascularly intact in the lower extremities bilaterally. Strong dorsalis pedis pulses. Full range of motion of the foot and heel. Achilles tendon intact.  Neurological: She is alert. No sensory deficit.  Skin: Skin is warm and dry. Capillary refill takes less than 2 seconds. No rash noted. She is not diaphoretic. No erythema. No pallor.  Psychiatric: She has a normal mood and affect.  Nursing note and vitals reviewed.    ED Treatments / Results  Labs (all labs ordered are listed, but only abnormal results are displayed) Labs Reviewed - No data to display  EKG  EKG Interpretation None       Radiology Dg Foot Complete Left  Result Date: 08/27/2016 CLINICAL DATA:  Left hindfoot pain and swelling. Blunt trauma several days ago. Initial encounter. EXAM: LEFT FOOT - COMPLETE 3+ VIEW COMPARISON:  None. FINDINGS: There is no evidence of fracture or dislocation. There is no evidence of arthropathy. Old fracture deformity of the third metatarsal shaft noted. No other bone lesions identified. Soft tissues are unremarkable. IMPRESSION: No acute findings. Electronically Signed   By: Earle Gell M.D.   On: 08/27/2016 18:46    Procedures Procedures (including critical care time) INCISION AND DRAINAGE Performed by: Emeline General Consent: Verbal consent obtained. Risks and benefits: risks, benefits and alternatives were discussed Type:  abscess  Body area: left heel  Anesthesia: local infiltration  Incision was made with a scalpel.  Local anesthetic: lidocaine 1% without epinephrine  Anesthetic total: 2 ml  Complexity:simple, no need for deloculation  Drainage: serous  Drainage amount: 7mL  Packing material: 1/4 in iodoform gauze  Patient tolerance: Patient tolerated the procedure well with no immediate complications.   The wound is cleansed, debrided of foreign material as much as possible, and dressed. The patient is alerted to watch for any signs of infection (redness, pus, pain, increased swelling or fever) and call if such occurs. Home wound care instructions are provided.  tdap up to date.  Medications Ordered in ED Medications  lidocaine (PF) (XYLOCAINE) 1 % injection 5 mL (5 mLs Infiltration Given by Other 08/27/16 2045)  acetaminophen (TYLENOL) tablet 650 mg (650 mg Oral Given 08/27/16 2120)  cloNIDine (CATAPRES) tablet 0.1 mg (0.1 mg Oral Given 08/27/16 2129)     Initial  Impression / Assessment and Plan / ED Course  I have reviewed the triage vital signs and the nursing notes.  Pertinent labs & imaging results that were available during my care of the patient were reviewed by me and considered in my medical decision making (see chart for details).     Patient presenting with left heel pain and swollen area with central fluctuance.  No purulent discharge, serous fluid on I&D. Patient reported relief after drainage.  Wound was irrigated and padded and dressing applied.  Noted elevated blood pressure in the ED. Patient denies any symptoms.  She has been non-compliant with anti-hypertensive meds for some time. Given clonidine with trending down improvement in ED.  Discussed at length the importance of management of her hypertension and cardiovascular/cerebrovascular risks associated with uncontrolled htn. Patient will be following up tomorrow with primary care provider. She is asymptomatic from  her blood pressure at this time, denies any headache, chest pain, dizziness, blurred vision or any other symptoms.  She was provided with information on obtaining orange card and where to follow up.  Patient is well-appearing in no distress. She understands instructions and has good support with her.  Dc home with close follow up. Discussed strict return precautions and advised to return to the emergency department if experiencing any new or worsening symptoms. Instructions were understood and patient agreed with discharge plan.  Final Clinical Impressions(s) / ED Diagnoses   Final diagnoses:  Pain of left heel  Blister of left foot, initial encounter    New Prescriptions Discharge Medication List as of 08/27/2016  8:52 PM       Emeline General, PA-C 08/28/16 0035    Drenda Freeze, MD 09/02/16 [phone]

## 2016-08-27 NOTE — ED Triage Notes (Signed)
Patient c/o left heel pain x 5 days. Patient reports pain is worse with weight bearing and making hard for her to walk.

## 2016-08-30 ENCOUNTER — Encounter: Payer: Self-pay | Admitting: Family Medicine

## 2016-08-30 ENCOUNTER — Ambulatory Visit (INDEPENDENT_AMBULATORY_CARE_PROVIDER_SITE_OTHER): Payer: Medicaid Other | Admitting: Family Medicine

## 2016-08-30 VITALS — BP 158/88 | HR 94 | Temp 98.3°F | Wt 134.0 lb

## 2016-08-30 DIAGNOSIS — I10 Essential (primary) hypertension: Secondary | ICD-10-CM

## 2016-08-30 DIAGNOSIS — L84 Corns and callosities: Secondary | ICD-10-CM

## 2016-08-30 DIAGNOSIS — R238 Other skin changes: Secondary | ICD-10-CM

## 2016-08-30 MED ORDER — IBUPROFEN 400 MG PO TABS: 400.0000 mg | ORAL_TABLET | Freq: Four times a day (QID) | ORAL | Status: AC | PRN

## 2016-08-30 NOTE — Patient Instructions (Signed)
It was nice seeing you today. Looks like your foot ulcer is from your callus. You will benefit from seeing a podiatrist for foot care. Please keep weight off the area as much as possible and give it time to heal. Use ibuprofen as needed for pain. Please pick up your BP meds from your pharmacy and see your PCP soon for BP management

## 2016-08-30 NOTE — Progress Notes (Signed)
Subjective:     Patient ID: Natalie Bishop, female   DOB: 02/10/1960, 56 y.o.   MRN: [id number]  HPI Heel ulcer:Blister developed on her left heel two weeks ago. She went to the ED where the blister was cut opened and packed open. She is here for follow-up. [id number] stated she has been off her BP meds since she is yet to pick up her meds due to cost. She denies any cardiac or neuro or respiratory symptoms. Feels well otherwise.  Current Outpatient Prescriptions on File Prior to Visit  Medication Sig Dispense Refill  . aspirin 81 MG tablet Take 1 tablet (81 mg total) by mouth daily. 30 tablet 5  . atorvastatin (LIPITOR) 40 MG tablet Take 1 tablet (40 mg total) by mouth daily. 90 tablet 3  . ibuprofen (ADVIL,MOTRIN) 200 MG tablet Take 2 tablets (400 mg total) by mouth every 6 (six) hours as needed. For pain 1 tablet 0  . lisinopril-hydrochlorothiazide (PRINZIDE,ZESTORETIC) 10-12.5 MG per tablet Take 1 tablet by mouth daily. 90 tablet 3  . metoprolol (LOPRESSOR) 50 MG tablet Take 1 tablet (50 mg total) by mouth 2 (two) times daily. 60 tablet 5   No current facility-administered medications on file prior to visit.    Past Medical History:  Diagnosis Date  . Anxiety   . 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  Marland Kitchen HLD (hyperlipidemia)   . Hypertension   . NSTEMI (non-ST elevated myocardial infarction) ([place])    ? type 2 from HTN urgency 10/2010: LHC with normal cors, EF 55-60%; chest CTA neg for pulmonary emboli  . Stroke [facility])      Review of Systems  Respiratory: Negative.   Cardiovascular: Negative.   Gastrointestinal: Negative.   Skin: Positive for wound.  All other systems reviewed and are negative.      Objective:   Physical Exam  Constitutional: She is oriented to person, place, and time. She appears well-developed. No distress.  Cardiovascular: Normal rate, regular rhythm, normal heart sounds and intact distal pulses.   No murmur heard. Pulmonary/Chest: Effort normal  and breath sounds normal. No respiratory distress. She has no wheezes.  Musculoskeletal: Normal range of motion. She exhibits no edema.       Feet:  Neurological: She is alert and oriented to person, place, and time.  Nursing note and vitals reviewed.        Assessment:     Left heel Bullous callus     HTN Plan:     Left heel Bullous callus S/P I&D at the ED Iodoform gauze removed, I don't think we need to replace gauze at this time. No pus or draining. Patient will benefit from podiatry evaluation for foot care. I recommended keep weight off the area as much as possible. I recommended offloading healing shoe at the pharmacy. Wound dressed with 4x4 and white bandage. F/U in 1 week for reassessment or sooner if worsening. She stated she is working on getting her orange card prior to referral to a podiatrist. Will discuss with her PCP.   HTN: Off meds.          She is advised to pick up meds as soon as she can.          Also her BP is just mildly elevated today.          She might not need to be on all the BP medications listed on file for her.  She is to follow-up with her PCP in 1-2 weeks to address this.          She verbalized understanding.

## 2017-03-28 ENCOUNTER — Ambulatory Visit: Payer: Self-pay | Admitting: Internal Medicine

## 2017-03-28 ENCOUNTER — Other Ambulatory Visit: Payer: Self-pay

## 2017-03-28 VITALS — BP 166/107 | HR 86 | Temp 98.4°F | Ht 65.0 in | Wt 146.0 lb

## 2017-03-28 DIAGNOSIS — R7303 Prediabetes: Secondary | ICD-10-CM | POA: Insufficient documentation

## 2017-03-28 DIAGNOSIS — R1032 Left lower quadrant pain: Secondary | ICD-10-CM

## 2017-03-28 DIAGNOSIS — Z91120 Patient's intentional underdosing of medication regimen due to financial hardship: Secondary | ICD-10-CM

## 2017-03-28 DIAGNOSIS — Z833 Family history of diabetes mellitus: Secondary | ICD-10-CM

## 2017-03-28 DIAGNOSIS — R300 Dysuria: Secondary | ICD-10-CM

## 2017-03-28 DIAGNOSIS — R350 Frequency of micturition: Secondary | ICD-10-CM

## 2017-03-28 DIAGNOSIS — L853 Xerosis cutis: Secondary | ICD-10-CM

## 2017-03-28 DIAGNOSIS — E785 Hyperlipidemia, unspecified: Secondary | ICD-10-CM

## 2017-03-28 DIAGNOSIS — Z720 Tobacco use: Secondary | ICD-10-CM

## 2017-03-28 DIAGNOSIS — R911 Solitary pulmonary nodule: Secondary | ICD-10-CM

## 2017-03-28 DIAGNOSIS — I10 Essential (primary) hypertension: Secondary | ICD-10-CM

## 2017-03-28 DIAGNOSIS — R6889 Other general symptoms and signs: Secondary | ICD-10-CM | POA: Insufficient documentation

## 2017-03-28 DIAGNOSIS — F17210 Nicotine dependence, cigarettes, uncomplicated: Secondary | ICD-10-CM

## 2017-03-28 DIAGNOSIS — R918 Other nonspecific abnormal finding of lung field: Secondary | ICD-10-CM

## 2017-03-28 LAB — POCT URINALYSIS DIPSTICK
Bilirubin, UA: NEGATIVE
GLUCOSE UA: NEGATIVE
KETONES UA: NEGATIVE
Nitrite, UA: POSITIVE
PROTEIN UA: NEGATIVE
Spec Grav, UA: 1.015 (ref 1.010–1.025)
UROBILINOGEN UA: 0.2 U/dL
pH, UA: 6.5 (ref 5.0–8.0)

## 2017-03-28 LAB — GLUCOSE, CAPILLARY: Glucose-Capillary: 101 mg/dL — ABNORMAL HIGH (ref 65–99)

## 2017-03-28 LAB — POCT GLYCOSYLATED HEMOGLOBIN (HGB A1C): HEMOGLOBIN A1C: 6

## 2017-03-28 MED ORDER — ATORVASTATIN CALCIUM 40 MG PO TABS: 40.0000 mg | ORAL_TABLET | Freq: Every day | ORAL | 11 refills | Status: DC

## 2017-03-28 MED ORDER — SULFAMETHOXAZOLE-TRIMETHOPRIM 800-160 MG PO TABS: 1.0000 | ORAL_TABLET | Freq: Two times a day (BID) | ORAL | 0 refills | Status: DC

## 2017-03-28 MED ORDER — LISINOPRIL-HYDROCHLOROTHIAZIDE 20-12.5 MG PO TABS: 1.0000 | ORAL_TABLET | Freq: Every day | ORAL | 11 refills | Status: DC

## 2017-03-28 MED ORDER — SULFAMETHOXAZOLE-TRIMETHOPRIM 800-160 MG PO TABS: 1.0000 | ORAL_TABLET | Freq: Two times a day (BID) | ORAL | 0 refills | Status: AC

## 2017-03-28 MED FILL — SULFAMETHOXAZOLE-TMP DS TAB: 800-160 | 5 days supply | Qty: 10 | Fill #0

## 2017-03-28 MED FILL — ATORVASTATIN 40 MG TABLET: 40 | 30 days supply | Qty: 30 | Fill #0

## 2017-03-28 MED FILL — LISINOPRIL-HCTZ 20-12.5 MG: 20-12.5 | 30 days supply | Qty: 30 | Fill #0

## 2017-03-28 NOTE — Assessment & Plan Note (Signed)
She was on Lipitor and aspirin before.  Not taking any medication for the past 2 years.  A new prescription of Lipitor was provided, as she can get it with $ 4 at [facility] outpatient pharmacy. We will check her lipid profile.

## 2017-03-28 NOTE — Assessment & Plan Note (Addendum)
BP Readings from Last 3 Encounters:  03/28/17 (!) 166/107  08/30/16 (!) 158/88  08/27/16 (!) 169/124   Her blood pressure was elevated.  She was not taking any antihypertensives because of the cost.  She cannot afford few medication with $4.00 each.  A new prescription of lisinopril-HCTZ 20-12.5 mg was sent to [facility] outpatient pharmacy with IM program. We will check BMP today.

## 2017-03-28 NOTE — Patient Instructions (Addendum)
Thank you for visiting clinic today. I sent your prescription of blood pressure medicine and antibiotic to [facility] outpatient pharmacy, these medicines are 4 dollars  each for the whole month supply. I am also ordering a chest x-ray to follow-up on your lung nodule, as CT chest is expensive. Please complete the whole course of antibiotic for 5 days, please visit [place] again if your symptoms persist at that time we might have to send your urine for culture. Follow-up in 1 month with your PCP.

## 2017-03-28 NOTE — Progress Notes (Addendum)
CC: Dysuria and to establish care.  HPI:  Natalie Bishop is a 57 y.o. with past medical history as listed below came to the clinic to establish care with [place].  She was complaining of increased urinary frequency, dysuria and occasional hematuria for the past 3-4 weeks.  She was also experiencing lower abdominal and left lower quadrant pain especially during micturition.  She was trying some over the counter stuff to help with her symptoms.  She denies any fever or chills.  She denies any flank pain.  She was also complaining of polyuria and polydipsia along with dry mouth, dry skin and some cold intolerance for a few months.  She denies any constipation or diarrhea.  She occasionally gets headache with blurry vision whenever her blood pressure is very high, she was on lisinopril-HCTZ combination pill and metoprolol in the past but was not taking any medication because of the cost for the past 2 years.  She denies any chest pain, cough, exertional dyspnea, orthopnea or PND. She occasionally gets night sweats. She denies any unintentional weight loss, no recent change in her appetite or bowel habits.  She was also complaining of inability to sleep well at night, she goes to bed around 8 but do play some video games on her phone and goes to sleep around midnight and wake up by 3-4 AM, and could not sleep after that.  She denies any excessive snoring but do feel tired and sleepy during the day.  She is having some family stresses and become worried about her bills.  Family history.  Positive for diabetes and hypertension in her mother and multiple siblings.  Social history.  Lives with her partner of 39 years, sexually active only with one female partner.  Had 2 grown up kids. Smokes about half pack per day, Uses marijuana most of the days as it help her to go to sleep and relax. Drinks alcohol occasionally.  Addendum.  Her ASCVD risk is 24.3% which can be decreased to 2% with risk  modification.  Patient was already restarted on Lipitor 40 mg daily.  Past Medical History:  Diagnosis Date  . Anxiety   . 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  Marland Kitchen HLD (hyperlipidemia)   . Hypertension   . NSTEMI (non-ST elevated myocardial infarction) ([place])    ? type 2 from HTN urgency 10/2010: LHC with normal cors, EF 55-60%; chest CTA neg for pulmonary emboli  . Stroke [facility])    Review of Systems: Negative except mentioned in HPI.  Physical Exam:  Vitals:   03/28/17 1004  BP: (!) 166/107  Pulse: 86  Temp: 98.4 F (36.9 C)  TempSrc: Oral  SpO2: 100%  Weight: 146 lb (66.2 kg)  Height: 5\' 5"  (1.651 m)   General: Vital signs reviewed.  Patient is well-developed and well-nourished, in no acute distress and cooperative with exam.  Head: Normocephalic and atraumatic. Eyes: EOMI, conjunctivae normal, no scleral icterus.  Neck: Supple, trachea midline, normal ROM, no JVD, masses, thyromegaly, or carotid bruit present.  Cardiovascular: RRR, S1 normal, S2 normal, no murmurs, gallops, or rubs. Pulmonary/Chest: Clear to auscultation bilaterally, no wheezes, rales, or rhonchi. Abdominal: Soft, mild lower abdomen and left lower quadrant tenderness, non-distended, BS + Extremities: No lower extremity edema bilaterally,  pulses symmetric and intact bilaterally. No cyanosis or clubbing. Neurological: A&O x3, Strength is normal and symmetric bilaterally, cranial nerve II-XII are grossly intact, no focal motor deficit, sensory intact to light touch bilaterally.  Skin: Warm, dry and intact. No rashes or erythema. Psychiatric: Normal mood and affect. speech and behavior is normal. Cognition and memory are normal.  Assessment & Plan:   See Encounters Tab for problem based charting.  Patient discussed with Dr. Beryle Beams.

## 2017-03-28 NOTE — Assessment & Plan Note (Signed)
She was having some symptoms of cold intolerance and dry skin. Denies any constipation.  We will check TSH today.

## 2017-03-28 NOTE — Progress Notes (Signed)
Medicine attending: Medical history, presenting problems, physical findings, and medications, reviewed with resident physician Dr Sumayya Amin on the day of the patient visit and I concur with her evaluation and management plan. 

## 2017-03-28 NOTE — Assessment & Plan Note (Signed)
She was counseled against smoking. Patient wants to quit but has not have a set date yet.  I reassured her that once she had a set date we can help her with some medication to facilitate her quitting.

## 2017-03-28 NOTE — Assessment & Plan Note (Signed)
Check A1c because of her complaint of polyuria, polydipsia and dry mouth. She had an extensive family history of diabetes.  Her A1c was 6.0 which makes her in prediabetic range. She was counseled about diet and to do exercise regularly. She will need an annual A1c.

## 2017-03-28 NOTE — Assessment & Plan Note (Signed)
She has an history of 2 small 4 mm lung nodules in her right lung while having a CTA done in 2012. Her PCP tried twice for follow-up CTs, patient never had it done because of the cost.  She denies any cough or any unintentional weight loss.  Occasionally gets night sweats.  An order was placed for a chest x-ray. She will need a CT chest for nodule follow-up once get her orange card. Patient was advised to meet Bonna Gains to work on getting an orange card.

## 2017-03-28 NOTE — Assessment & Plan Note (Signed)
She was having symptoms of UTI and urine dipstick was positive for mild blood, leukocytes and nitrites.  Culture was not sent because of the cost issue.  She was given a prescription for Nino Parsley was advised to come back to the clinic if her symptoms fail to resolve or get worse, we will send urine culture at that time.

## 2017-03-29 LAB — LIPID PANEL
CHOL/HDL RATIO: 3.9 ratio (ref 0.0–4.4)
Cholesterol, Total: 210 mg/dL — ABNORMAL HIGH (ref 100–199)
HDL: 54 mg/dL (ref >39)
LDL CALC: 116 mg/dL — AB (ref 0–99)
Triglycerides: 199 mg/dL — ABNORMAL HIGH (ref 0–149)
VLDL CHOLESTEROL CAL: 40 mg/dL (ref 5–40)

## 2017-03-29 LAB — TSH: TSH: 0.474 u[IU]/mL (ref 0.450–4.500)

## 2017-03-29 LAB — BMP8+ANION GAP
ANION GAP: 15 mmol/L (ref 10.0–18.0)
BUN/Creatinine Ratio: 18 (ref 9–23)
BUN: 12 mg/dL (ref 6–24)
CO2: 23 mmol/L (ref 20–29)
CREATININE: 0.67 mg/dL (ref 0.57–1.00)
Calcium: 10.1 mg/dL (ref 8.7–10.2)
Chloride: 107 mmol/L — ABNORMAL HIGH (ref 96–106)
GFR calc Af Amer: 114 mL/min/{1.73_m2} (ref >59)
GFR, EST NON AFRICAN AMERICAN: 99 mL/min/{1.73_m2} (ref >59)
Glucose: 88 mg/dL (ref 65–99)
POTASSIUM: 4.4 mmol/L (ref 3.5–5.2)
SODIUM: 145 mmol/L — AB (ref 134–144)

## 2017-05-09 ENCOUNTER — Ambulatory Visit: Payer: Self-pay

## 2018-09-24 ENCOUNTER — Encounter (HOSPITAL_COMMUNITY): Payer: Self-pay

## 2018-09-24 ENCOUNTER — Other Ambulatory Visit: Payer: Self-pay

## 2018-09-24 ENCOUNTER — Emergency Department (HOSPITAL_COMMUNITY)
Admission: EM | Admit: 2018-09-24 | Discharge: 2018-09-24 | Disposition: A | Discharge status: Home / Self Care | Payer: Medicaid Other | Attending: Emergency Medicine | Admitting: Emergency Medicine

## 2018-09-24 ENCOUNTER — Emergency Department (HOSPITAL_COMMUNITY): Payer: Medicaid Other

## 2018-09-24 DIAGNOSIS — F17210 Nicotine dependence, cigarettes, uncomplicated: Secondary | ICD-10-CM | POA: Insufficient documentation

## 2018-09-24 DIAGNOSIS — I10 Essential (primary) hypertension: Secondary | ICD-10-CM | POA: Diagnosis not present

## 2018-09-24 DIAGNOSIS — Z79899 Other long term (current) drug therapy: Secondary | ICD-10-CM | POA: Diagnosis not present

## 2018-09-24 DIAGNOSIS — K5792 Diverticulitis of intestine, part unspecified, without perforation or abscess without bleeding: Secondary | ICD-10-CM | POA: Diagnosis not present

## 2018-09-24 DIAGNOSIS — E876 Hypokalemia: Secondary | ICD-10-CM | POA: Diagnosis not present

## 2018-09-24 DIAGNOSIS — R1030 Lower abdominal pain, unspecified: Secondary | ICD-10-CM | POA: Diagnosis present

## 2018-09-24 LAB — COMPREHENSIVE METABOLIC PANEL
ALT: 7 U/L (ref 0–44)
AST: 12 U/L — ABNORMAL LOW (ref 15–41)
Albumin: 4.1 g/dL (ref 3.5–5.0)
Alkaline Phosphatase: 52 U/L (ref 38–126)
Anion gap: 10 (ref 5–15)
BUN: 10 mg/dL (ref 6–20)
CO2: 27 mmol/L (ref 22–32)
Calcium: 9.5 mg/dL (ref 8.9–10.3)
Chloride: 103 mmol/L (ref 98–111)
Creatinine, Ser: 0.69 mg/dL (ref 0.44–1.00)
GFR calc Af Amer: >60 mL/min (ref >60)
GFR calc non Af Amer: >60 mL/min (ref >60)
Glucose, Bld: 98 mg/dL (ref 70–99)
Potassium: 2.5 mmol/L — CL (ref 3.5–5.1)
Sodium: 140 mmol/L (ref 135–145)
Total Bilirubin: 1 mg/dL (ref 0.3–1.2)
Total Protein: 7.8 g/dL (ref 6.5–8.1)

## 2018-09-24 LAB — CBC
HCT: 36 % (ref 36.0–46.0)
Hemoglobin: 11.7 g/dL — ABNORMAL LOW (ref 12.0–15.0)
MCH: 25.9 pg — ABNORMAL LOW (ref 26.0–34.0)
MCHC: 32.5 g/dL (ref 30.0–36.0)
MCV: 79.6 fL — ABNORMAL LOW (ref 80.0–100.0)
Platelets: 243 10*3/uL (ref 150–400)
RBC: 4.52 MIL/uL (ref 3.87–5.11)
RDW: 15.2 % (ref 11.5–15.5)
WBC: 10.5 10*3/uL (ref 4.0–10.5)
nRBC: 0 % (ref 0.0–0.2)

## 2018-09-24 LAB — ABO/RH: ABO/RH(D): O POS

## 2018-09-24 LAB — URINALYSIS, ROUTINE W REFLEX MICROSCOPIC
Bilirubin Urine: NEGATIVE
Glucose, UA: NEGATIVE mg/dL
Ketones, ur: NEGATIVE mg/dL
Leukocytes,Ua: NEGATIVE
Nitrite: NEGATIVE
Protein, ur: NEGATIVE mg/dL
Specific Gravity, Urine: 1.008 (ref 1.005–1.030)
pH: 5 (ref 5.0–8.0)

## 2018-09-24 LAB — TYPE AND SCREEN
ABO/RH(D): O POS
Antibody Screen: NEGATIVE

## 2018-09-24 LAB — MAGNESIUM: Magnesium: 2.5 mg/dL — ABNORMAL HIGH (ref 1.7–2.4)

## 2018-09-24 LAB — POC OCCULT BLOOD, ED: Fecal Occult Bld: NEGATIVE

## 2018-09-24 MED ORDER — ONDANSETRON HCL 4 MG/2ML IJ SOLN
4.0000 mg | Freq: Once | INTRAMUSCULAR | Status: AC
  Administered 2018-09-24: 4 mg via INTRAVENOUS
  Filled 2018-09-24: qty 2

## 2018-09-24 MED ORDER — POTASSIUM CHLORIDE CRYS ER 20 MEQ PO TBCR: 20.0000 meq | EXTENDED_RELEASE_TABLET | Freq: Every day | ORAL | 0 refills | Status: AC

## 2018-09-24 MED ORDER — OXYCODONE-ACETAMINOPHEN 5-325 MG PO TABS: 1.0000 | ORAL_TABLET | Freq: Four times a day (QID) | ORAL | 0 refills | Status: AC | PRN

## 2018-09-24 MED ORDER — SODIUM CHLORIDE 0.9 % IV BOLUS
1000.0000 mL | Freq: Once | INTRAVENOUS | Status: AC
  Administered 2018-09-24: 1000 mL via INTRAVENOUS

## 2018-09-24 MED ORDER — POTASSIUM CHLORIDE 10 MEQ/100ML IV SOLN
10.0000 meq | INTRAVENOUS | Status: AC
  Administered 2018-09-24 (×3): 10 meq via INTRAVENOUS
  Filled 2018-09-24 (×3): qty 100

## 2018-09-24 MED ORDER — MORPHINE SULFATE (PF) 4 MG/ML IV SOLN
4.0000 mg | Freq: Once | INTRAVENOUS | Status: AC
  Administered 2018-09-24: 4 mg via INTRAVENOUS
  Filled 2018-09-24: qty 1

## 2018-09-24 MED ORDER — IOHEXOL 300 MG/ML  SOLN
100.0000 mL | Freq: Once | INTRAMUSCULAR | Status: AC | PRN
  Administered 2018-09-24: 14:00:00 100 mL via INTRAVENOUS

## 2018-09-24 MED ORDER — SODIUM CHLORIDE (PF) 0.9 % IJ SOLN
INTRAMUSCULAR | Status: AC
  Administered 2018-09-24: 15:00:00
  Filled 2018-09-24: qty 50

## 2018-09-24 MED ORDER — CIPROFLOXACIN HCL 500 MG PO TABS: 500.0000 mg | ORAL_TABLET | Freq: Two times a day (BID) | ORAL | 0 refills | Status: AC

## 2018-09-24 MED ORDER — POTASSIUM CHLORIDE CRYS ER 20 MEQ PO TBCR
60.0000 meq | EXTENDED_RELEASE_TABLET | Freq: Once | ORAL | Status: AC
  Administered 2018-09-24: 60 meq via ORAL
  Filled 2018-09-24: qty 3

## 2018-09-24 MED ORDER — HYDROMORPHONE HCL 1 MG/ML IJ SOLN
1.0000 mg | Freq: Once | INTRAMUSCULAR | Status: AC
  Administered 2018-09-24: 1 mg via INTRAVENOUS
  Filled 2018-09-24: qty 1

## 2018-09-24 MED ORDER — METRONIDAZOLE 500 MG PO TABS: 500.0000 mg | ORAL_TABLET | Freq: Three times a day (TID) | ORAL | 0 refills | Status: AC

## 2018-09-24 MED ORDER — ONDANSETRON 4 MG PO TBDP: 4.0000 mg | ORAL_TABLET | Freq: Three times a day (TID) | ORAL | 0 refills | Status: AC | PRN

## 2018-09-24 NOTE — ED Notes (Signed)
Date and time results received: 09/24/18 now (use smartphrase ".now" to insert current time)  Test: Potassium Critical Value: 2.5  Name of Provider Notified: Long  Orders Received? Or Actions Taken?: Actions Taken: awaiting

## 2018-09-24 NOTE — Discharge Instructions (Addendum)
You were seen in the emergency department today for abdominal pain.  Your labs show that your potassium was very low, we are sending you with a potassium supplement and would like you to include potassium rich foods in your diet.  Your urine had some blood in it, this should also be rechecked.  Your CT scan showed findings of diverticulitis, please see attached handout, we are treating this with antibiotics, ciprofloxacin and Flagyl, do not drink alcohol while taking antibiotics, be sure to avoid quick movements or exercise while taking the Cipro as this puts you at higher risk for tendon injury.  We are sending home with Zofran to take as needed for nausea and vomiting. We are sending you home with Percocet to help with severe pain.   -Percocet-this is a narcotic/controlled substance medication that has potential addicting qualities.  We recommend that you take 1-2 tablets every 6 hours as needed for severe pain.  Do not drive or operate heavy machinery when taking this medicine as it can be sedating. Do not drink alcohol or take other sedating medications when taking this medicine for safety reasons.  Keep this out of reach of small children.  Please be aware this medicine has Tylenol in it (325 mg/tab) do not exceed the maximum dose of Tylenol in a day per over the counter recommendations should you decide to supplement with Tylenol over the counter.   Percocet can make you constipated, therefore we recommend starting an over-the-counter laxative such as MiraLAX as needed.  We have prescribed you new medication(s) today. Discuss the medications prescribed today with your pharmacist as they can have adverse effects and interactions with your other medicines including over the counter and prescribed medications. Seek medical evaluation if you start to experience new or abnormal symptoms after taking one of these medicines, seek care immediately if you start to experience difficulty breathing, feeling of  your throat closing, facial swelling, or rash as these could be indications of a more serious allergic reaction  Please follow-up with your primary care provider within 3 days.  Return to the ER for new or worsening symptoms or any other concerns including but not limited to fever, increased pain, inability to keep fluids down, blood in your stool, inability to have a bowel movement or pass gas, or any other concerns.

## 2018-09-24 NOTE — ED Provider Notes (Signed)
[facility] DEPT Provider Note   CSN: [id number] Arrival date & time: 09/24/18  [id number]     History   Chief Complaint Chief Complaint  Patient presents with   Abdominal Pain    HPI Natalie Bishop is a 58 y.o. female with a hx of tobacco abuse, hyperlipidemia, HTN, CAD, & prior tubal ligation who presents to the ED with complaints of abdominal pain x 2 days. Patient states pain is located in the mid to lower abdomen, constant pain with sharp intermittent increases which occur rather frequently. Worse with movement & attempts to eat, no other alleviating/aggravating factors. States that she has had associated nausea & constipation- last BM 3 days prior- firm and small with a bit of blood on the toilet paper. No recurrence of bleeding. No BM since, has passed gas once. Has some increased pain with urination but not necessarily dysuria. Denies fever, chills, emesis, melena, vaginal bleeding, or vaginal discharge. Has had minimal PO intake over the past couple of days.     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   HLD (hyperlipidemia)    Hypertension    NSTEMI (non-ST elevated myocardial infarction) ([place])    ? 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       Home Medications    Prior to Admission medications   Medication Sig Start Date End Date Taking? Authorizing Provider  aspirin 81 MG tablet Take 1 tablet (81 mg total) by mouth daily. Patient not taking: Reported on 08/30/2016 08/06/13   Kennith Maes R, DO  atorvastatin (LIPITOR) 40 MG tablet Take 1 tablet (40 mg total) by mouth daily. IM Program 03/28/17 03/28/18  Lorella Nimrod, MD  lisinopril-hydrochlorothiazide (PRINZIDE,ZESTORETIC) 20-12.5 MG tablet Take 1 tablet by mouth daily. IM Program 03/28/17   Lorella Nimrod, MD  metoprolol (LOPRESSOR) 50 MG tablet Take 1 tablet (50 mg total) by mouth 2 (two) times daily. Patient not taking: Reported on 08/30/2016 08/06/13   Nolon Rod, DO    Family History Family History  Problem Relation Age of Onset   Coronary artery disease Mother    Diabetes Mother 80   Diabetes type II Mother    Heart attack Mother 51       MI   Depression Mother    Hyperlipidemia Mother    Hypertension Mother    Stroke Mother     Social History Social History   Tobacco Use   Smoking status: Current Every Day Smoker    Packs/day: 0.50    Types: Cigarettes   Smokeless tobacco: Never Used  Substance Use Topics   Alcohol use: Yes   Drug use: Yes    Types: Marijuana  Allergies   Penicillins   Review of Systems Review of Systems  Constitutional: Negative for chills and fever.  Respiratory: Negative for shortness of breath.   Cardiovascular: Negative for chest pain.  Gastrointestinal: Positive for abdominal pain, anal bleeding (x1), constipation and nausea. Negative for blood in stool, diarrhea and vomiting.  Genitourinary: Negative for vaginal bleeding and vaginal discharge.  Neurological: Negative for syncope.  All other systems reviewed and are negative.    Physical Exam Updated Vital Signs BP (!) 119/94 (BP Location: Left Arm)    Pulse 98    Temp 98.6 F (37 C) (Oral)    Resp 16    Ht 5\' 5"  (1.651 m)    Wt 65.8 kg    SpO2 96%     BMI 24.13 kg/m   Physical Exam Vitals signs and nursing note reviewed. Exam conducted with a chaperone present.  Constitutional:      General: She is not in acute distress.    Appearance: She is well-developed. She is not toxic-appearing.  HENT:     Head: Normocephalic and atraumatic.  Eyes:     General:        Right eye: No discharge.        Left eye: No discharge.     Conjunctiva/sclera: Conjunctivae normal.  Neck:     Musculoskeletal: Neck supple.  Cardiovascular:     Rate and Rhythm: Normal rate and regular rhythm.  Pulmonary:     Effort: Pulmonary effort is normal. No respiratory distress.     Breath sounds: Normal breath sounds. No wheezing, rhonchi or rales.  Abdominal:     General: There is no distension.     Palpations: Abdomen is soft.     Tenderness: There is abdominal tenderness (mid to lower abdomen bilaterally ) in the right lower quadrant, periumbilical area, suprapubic area and left lower quadrant. There is guarding (mild). There is no right CVA tenderness, left CVA tenderness or rebound. Negative signs include Murphy's sign.  Genitourinary:    Comments: DRE: Small amount of brown stool present, minimal stool present in rectal vault. No melena/bright red blood. Very small external hemorrhoids present w/o thrombosis.  Skin:    General: Skin is warm and dry.     Findings: No rash.  Neurological:     Mental Status: She is alert.     Comments: Clear speech.   Psychiatric:        Behavior: Behavior normal.     ED Treatments / Results  Labs (all labs ordered are listed, but only abnormal results are displayed) Labs Reviewed  CBC - Abnormal; Notable for the following components:      Result Value   Hemoglobin 11.7 (*)    MCV 79.6 (*)    MCH 25.9 (*)    All other components within normal limits  COMPREHENSIVE METABOLIC PANEL  POC OCCULT BLOOD, ED  TYPE AND SCREEN  ABO/RH    EKG EKG Interpretation  Date/Time:  Thursday September 24 2018 12:19:18  EDT Ventricular Rate:  72 PR Interval:    QRS Duration: 117 QT Interval:  415 QTC Calculation: 455 R Axis:   19 Text Interpretation:  Sinus rhythm Probable left atrial enlargement Incomplete right bundle branch block No STEMI  Confirmed by Nanda Quinton [phone]) on 09/24/2018 12:33:51 PM   Radiology Ct Abdomen Pelvis W Contrast  Result Date: 09/24/2018 CLINICAL DATA:  Abdominal pain EXAM: CT ABDOMEN AND PELVIS WITH CONTRAST TECHNIQUE: Multidetector CT imaging of the abdomen and pelvis was performed  using the standard protocol following bolus administration of intravenous contrast. CONTRAST:  159mL OMNIPAQUE IOHEXOL 300 MG/ML  SOLN COMPARISON:  None. FINDINGS: Lower chest: The visualized heart size within normal limits. No pericardial fluid/thickening. No hiatal hernia. Streaky atelectasis seen at both lung bases. Hepatobiliary: The liver is normal in density without focal abnormality.The main portal vein is patent. No evidence of calcified gallstones, gallbladder wall thickening or biliary dilatation. Pancreas: Unremarkable. No pancreatic ductal dilatation or surrounding inflammatory changes. Spleen: Normal in size without focal abnormality. Adrenals/Urinary Tract: Both adrenal glands appear normal. Small subcentimeter low-density lesions seen in the upper pole of the right kidney, too small to further characterize. Stomach/Bowel: The stomach and small bowel are normal in appearance. There is a focal 5 cm segment of sigmoid colon which appears to be diffusely thickened with surrounding mesenteric fat stranding changes and colonic diverticula. No free air is identified. No loculated fluid collections are identified. Vascular/Lymphatic: There are no enlarged mesenteric, retroperitoneal, or pelvic lymph nodes. Scattered aortic atherosclerotic calcifications are seen without aneurysmal dilatation. Reproductive: The uterus and adnexa are unremarkable. Other: No evidence of abdominal wall mass or hernia.  Musculoskeletal: No acute or significant osseous findings. IMPRESSION: Sigmoid colonic diverticulitis. No evidence of pericolonic fluid collections or free air. Electronically Signed   By: Prudencio Pair M.D.   On: 09/24/2018 14:42    Procedures Procedures (including critical care time)  Medications Ordered in ED Medications  potassium chloride 10 mEq in 100 mL IVPB (10 mEq Intravenous New Bag/Given 09/24/18 1545)  morphine 4 MG/ML injection 4 mg (4 mg Intravenous Given 09/24/18 1213)  sodium chloride 0.9 % bolus 1,000 mL (1,000 mLs Intravenous New Bag/Given 09/24/18 1221)  ondansetron (ZOFRAN) injection 4 mg (4 mg Intravenous Given 09/24/18 1213)  iohexol (OMNIPAQUE) 300 MG/ML solution 100 mL (100 mLs Intravenous Contrast Given 09/24/18 1412)  sodium chloride (PF) 0.9 % injection (  Contrast Given 09/24/18 1448)  HYDROmorphone (DILAUDID) injection 1 mg (1 mg Intravenous Given 09/24/18 1540)  potassium chloride SA (K-DUR) CR tablet 60 mEq (60 mEq Oral Given 09/24/18 1545)     Initial Impression / Assessment and Plan / ED Course  I have reviewed the triage vital signs and the nursing notes.  Pertinent labs & imaging results that were available during my care of the patient were reviewed by me and considered in my medical decision making (see chart for details).   Patient presents to the ED with complaints of abdominal pain. Patient nontoxic appearing, in no apparent distress, vitals WNL. On exam patient tender to mid to lower abdomen w/ mild guarding. Will evaluate with labs and CT A/P. Analgesics, anti-emetics, and fluids administered. Also mentioned 1 episode of blood on toilet paper w/ hard BM- DRE w/ minimal brown stool, no melena or hematochezia, fecal occult negative- suspect irritation secondary to straining.   ER work-up reviewed:  CBC: No leukocytosis.  Anemia similar to prior. CMP: Hypokalemia at 2.5, magnesium level added on & slightly elevated, EKG obtained without significant concerning  changes, QTC 455-we will start IV and plan for oral replacement.  Renal function and LFTs without significant abnormality. Lipase: WNL. UA: Hematuria, no obvious UTI.  CT A/P: Sigmoid colonic diverticulitis. No evidence of pericolonic fluid collections or free air.   Patient feeling improved following analgesics in the emergency department, she has been able to tolerate 60 M EQ of p.o. potassium.  She seems appropriate for discharge home with analgesics, antiemetics, potassium supplementation, and antibiotics.  Will start Cipro/flagyl to cover  for diverticulitis, PCN allergic, discussed risk of tendon injury.  PCP follow-up.. I discussed results, treatment plan, need for PCP follow-up, and return precautions with the patient. Provided opportunity for questions, patient confirmed understanding and is in agreement with plan.   Findings and plan of care discussed with supervising physician Dr. Laverta Baltimore who is in agreement.   Final Clinical Impressions(s) / ED Diagnoses   Final diagnoses:  Diverticulitis  Hypokalemia    ED Discharge Orders         Ordered    oxyCODONE-acetaminophen (PERCOCET/ROXICET) 5-325 MG tablet  Every 6 hours PRN     09/24/18 1646    ondansetron (ZOFRAN ODT) 4 MG disintegrating tablet  Every 8 hours PRN     09/24/18 1646    metroNIDAZOLE (FLAGYL) 500 MG tablet  3 times daily     09/24/18 1646    ciprofloxacin (CIPRO) 500 MG tablet  2 times daily     09/24/18 1646    potassium chloride SA (K-DUR) 20 MEQ tablet  Daily     09/24/18 1646           Amaryllis Dyke, PA-C 09/24/18 1646    Margette Fast, MD 09/25/18 [phone]

## 2018-09-24 NOTE — ED Triage Notes (Signed)
Patient c/o lower abdominal pain x 2 days. Patient states has not had a normal BM in 2 days. Patient states she had a small amount of bright red blood on the toilet paper when she wiped after having a small constipated BM yesterday.

## 2020-07-04 ENCOUNTER — Telehealth: Payer: Self-pay | Admitting: *Deleted

## 2020-07-04 ENCOUNTER — Encounter: Payer: Medicaid Other | Admitting: Internal Medicine

## 2020-07-04 NOTE — Telephone Encounter (Signed)
CALLED PATIENT REGARDING HER MISSED APPOINTMENT. LVM FOR PATIENT TO CALL THE CLINIC AT [phone] TO RESCHEDULE THIS APPOINTMENT.

## 2020-08-31 ENCOUNTER — Other Ambulatory Visit: Payer: Self-pay | Admitting: Family Medicine

## 2020-08-31 DIAGNOSIS — Z1231 Encounter for screening mammogram for malignant neoplasm of breast: Secondary | ICD-10-CM

## 2020-09-25 IMAGING — CT CT ABD-PELV W/ CM
2 of 5 series · 16 of 46 positions shown, 18 images · IV contrast (omnipaque)
Comparison: None.

CLINICAL DATA: Abdominal pain

EXAM:
CT ABDOMEN AND PELVIS WITH CONTRAST
TECHNIQUE: Multidetector CT imaging of the abdomen and pelvis was performed
using the standard protocol following bolus administration of
intravenous contrast.
CONTRAST:  100mL OMNIPAQUE IOHEXOL 300 MG/ML  SOLN

[Series 2: axial st · axial · 0.68mm/px · z∈[-384,-34]mm · 13 of 82 slices shown, 15 images]
[im 6/82  soft-tissue]
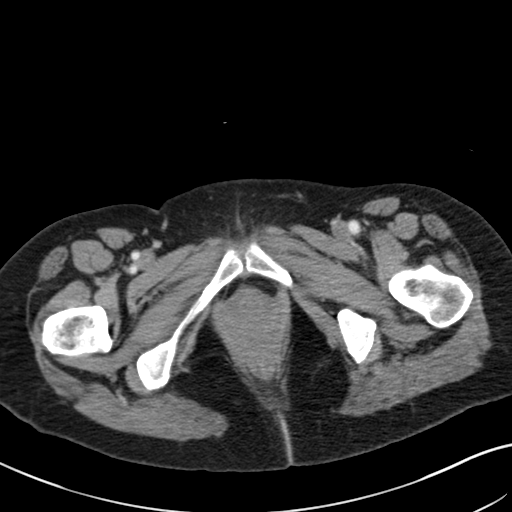
[im 6/82  bone]
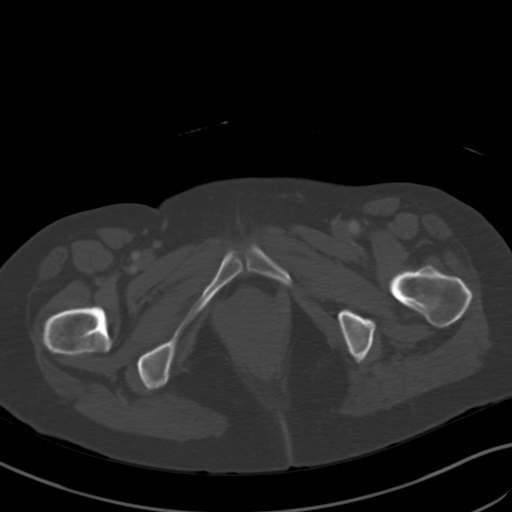
[im 11/82  soft-tissue]
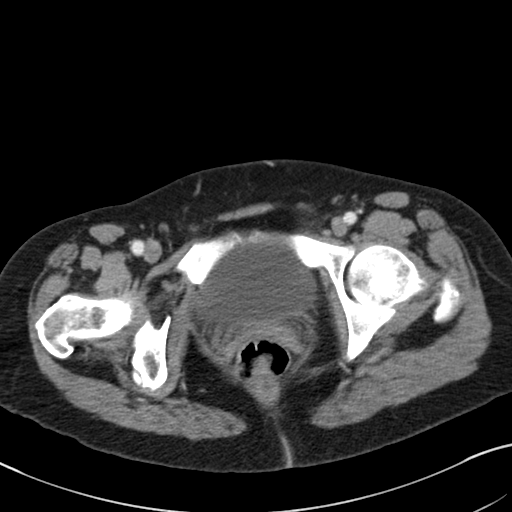
[im 16/82  soft-tissue]
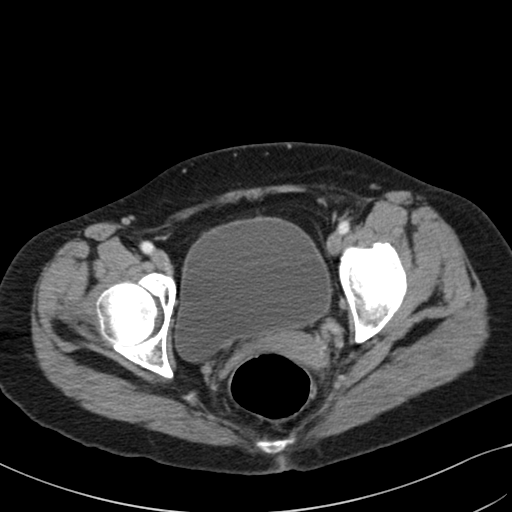
[im 26/82  soft-tissue]
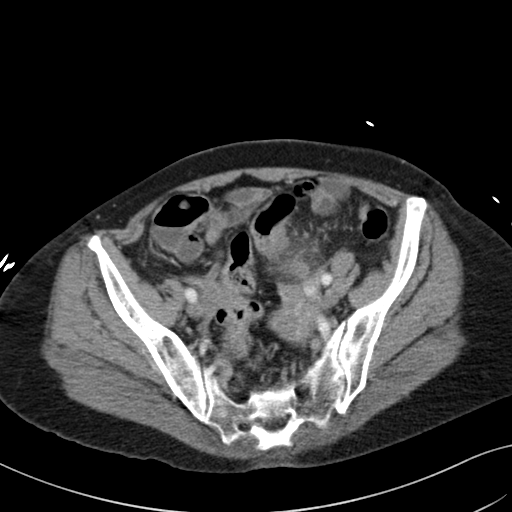
[im 31/82  soft-tissue]
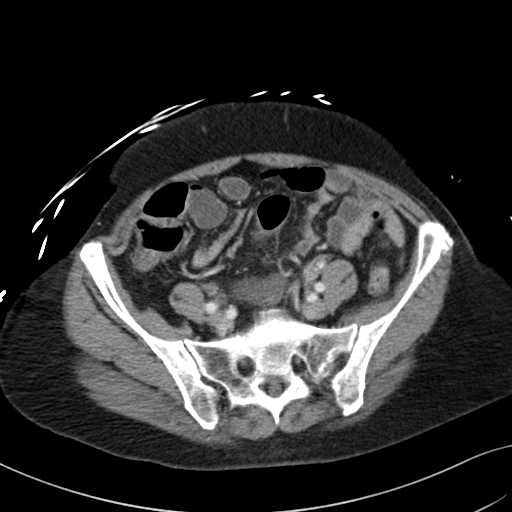
[im 36/82  soft-tissue]
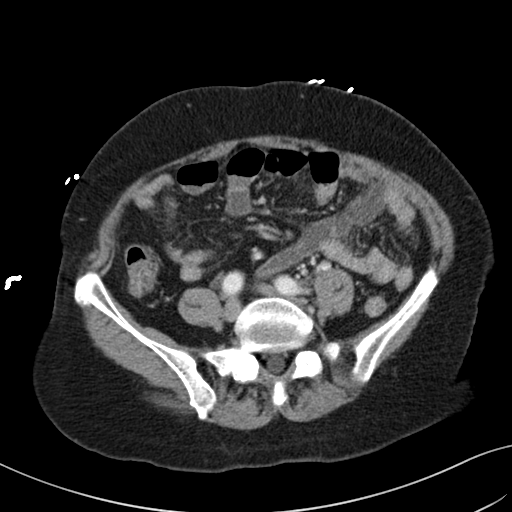
[im 41/82  soft-tissue]
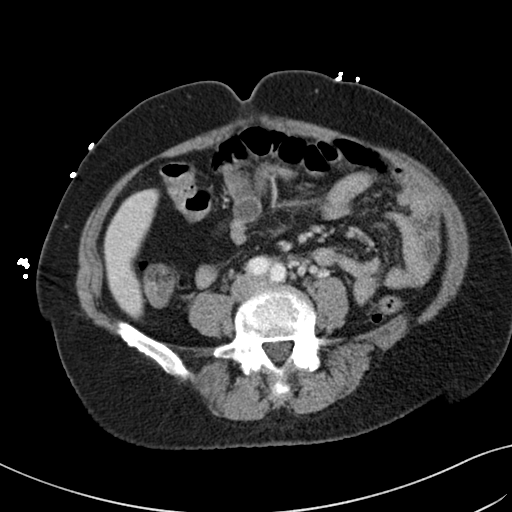
[im 46/82  soft-tissue]
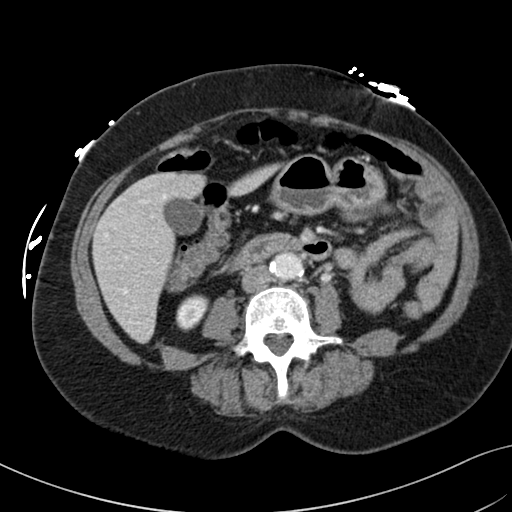
[im 51/82  soft-tissue]
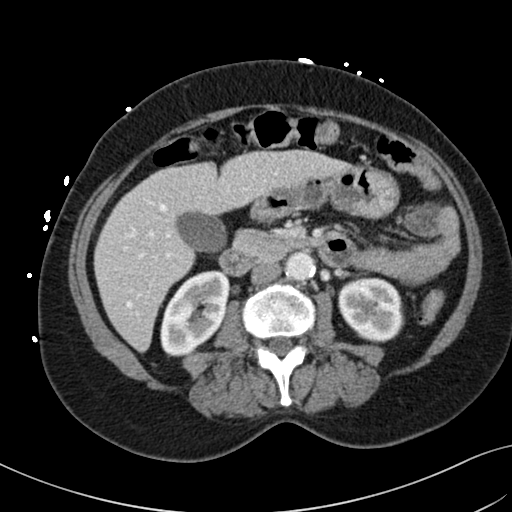
[im 51/82  bone]
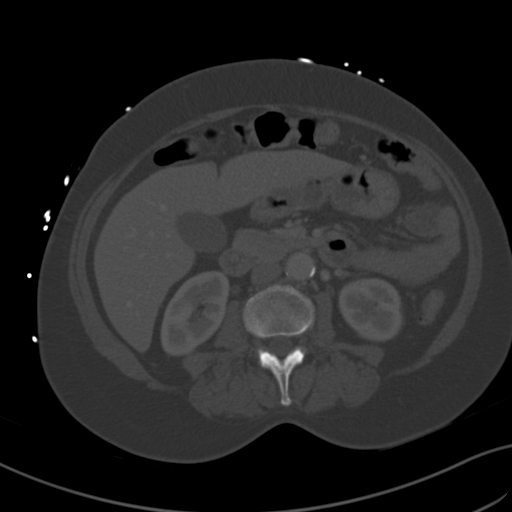
[im 56/82  soft-tissue]
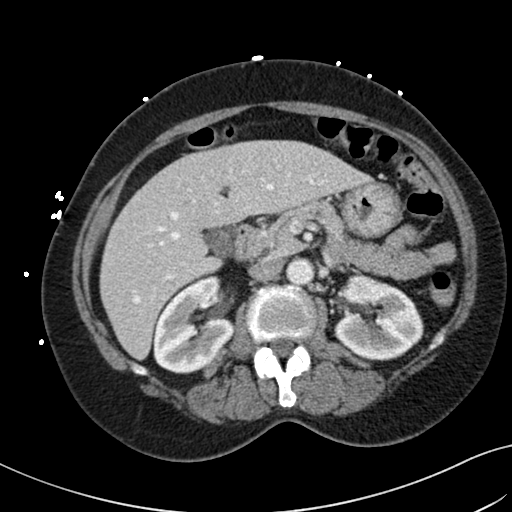
[im 66/82  soft-tissue]
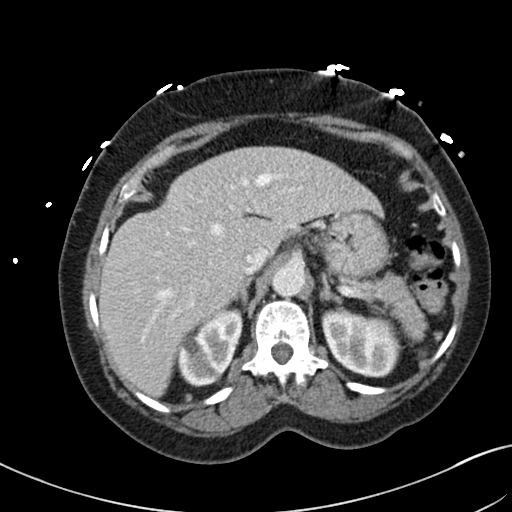
[im 71/82  soft-tissue]
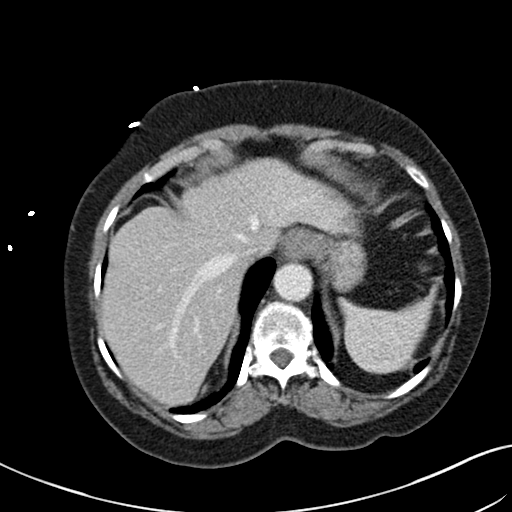
[im 76/82  soft-tissue]
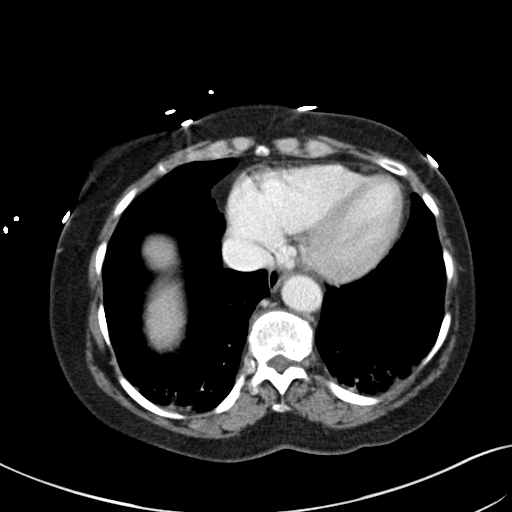

[Series 4: coronal st · coronal · 0.70mm/px · 3 of 149 slices shown]
[im 50/149  soft-tissue]
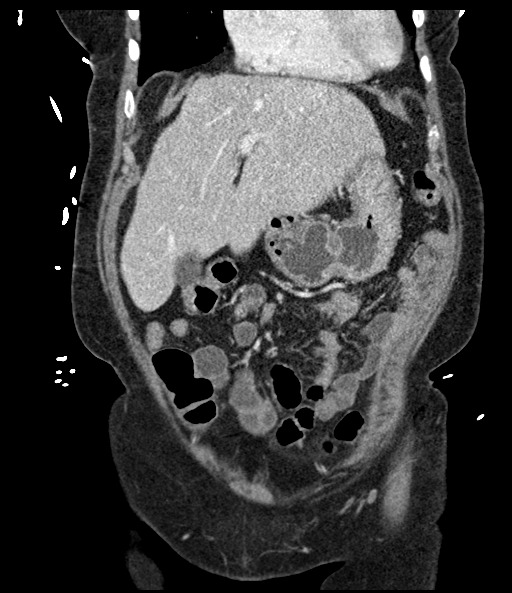
[im 66/149  soft-tissue]
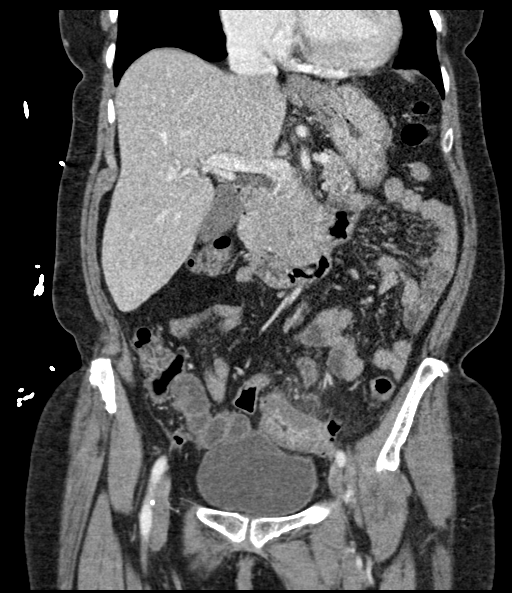
[im 83/149  soft-tissue]
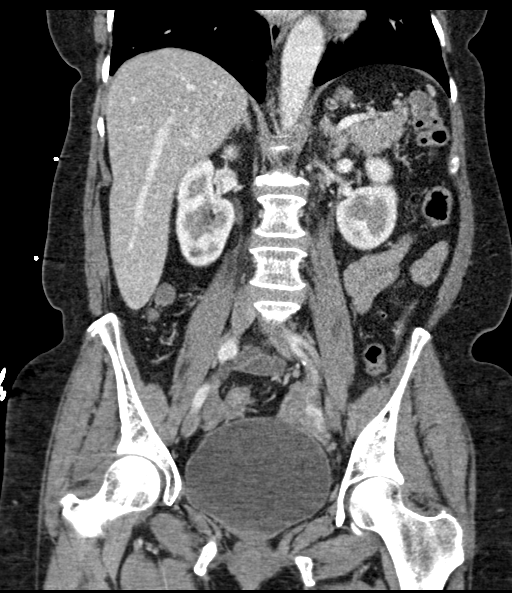

[16 of 46 positions shown; findings below may reference images not displayed]

FINDINGS: Lower chest: The visualized heart size within normal limits. No
pericardial fluid/thickening.

No hiatal hernia.

Streaky atelectasis seen at both lung bases.

Hepatobiliary: The liver is normal in density without focal
abnormality.The main portal vein is patent. No evidence of calcified
gallstones, gallbladder wall thickening or biliary dilatation.

Pancreas: Unremarkable. No pancreatic ductal dilatation or
surrounding inflammatory changes.

Spleen: Normal in size without focal abnormality.

Adrenals/Urinary Tract: Both adrenal glands appear normal. Small
subcentimeter low-density lesions seen in the upper pole of the
right kidney, too small to further characterize.

Stomach/Bowel: The stomach and small bowel are normal in appearance.
There is a focal 5 cm segment of sigmoid colon which appears to be
diffusely thickened with surrounding mesenteric fat stranding
changes and colonic diverticula. No free air is identified. No
loculated fluid collections are identified.

Vascular/Lymphatic: There are no enlarged mesenteric,
retroperitoneal, or pelvic lymph nodes. Scattered aortic
atherosclerotic calcifications are seen without aneurysmal
dilatation.

Reproductive: The uterus and adnexa are unremarkable.

Other: No evidence of abdominal wall mass or hernia.

Musculoskeletal: No acute or significant osseous findings.
IMPRESSION: Sigmoid colonic diverticulitis. No evidence of pericolonic fluid
collections or free air.

## 2020-11-27 ENCOUNTER — Encounter: Payer: Medicaid Other | Admitting: Obstetrics and Gynecology

## 2023-09-23 DIAGNOSIS — M159 Polyosteoarthritis, unspecified: Secondary | ICD-10-CM | POA: Diagnosis not present

## 2023-09-23 DIAGNOSIS — Z136 Encounter for screening for cardiovascular disorders: Secondary | ICD-10-CM | POA: Diagnosis not present

## 2023-09-23 DIAGNOSIS — R7303 Prediabetes: Secondary | ICD-10-CM | POA: Diagnosis not present

## 2023-09-23 DIAGNOSIS — E785 Hyperlipidemia, unspecified: Secondary | ICD-10-CM | POA: Diagnosis not present

## 2023-09-23 DIAGNOSIS — Z1159 Encounter for screening for other viral diseases: Secondary | ICD-10-CM | POA: Diagnosis not present

## 2023-09-23 DIAGNOSIS — I700 Atherosclerosis of aorta: Secondary | ICD-10-CM | POA: Diagnosis not present

## 2023-09-23 DIAGNOSIS — Z0189 Encounter for other specified special examinations: Secondary | ICD-10-CM | POA: Diagnosis not present

## 2023-09-23 DIAGNOSIS — Z79899 Other long term (current) drug therapy: Secondary | ICD-10-CM | POA: Diagnosis not present

## 2023-09-23 DIAGNOSIS — R911 Solitary pulmonary nodule: Secondary | ICD-10-CM | POA: Diagnosis not present

## 2023-10-13 ENCOUNTER — Other Ambulatory Visit: Payer: Self-pay | Admitting: Nurse Practitioner

## 2023-10-13 DIAGNOSIS — R911 Solitary pulmonary nodule: Secondary | ICD-10-CM

## 2023-10-13 DIAGNOSIS — F17210 Nicotine dependence, cigarettes, uncomplicated: Secondary | ICD-10-CM

## 2023-10-29 ENCOUNTER — Other Ambulatory Visit: Payer: Self-pay | Admitting: Nurse Practitioner

## 2023-10-29 DIAGNOSIS — Z1231 Encounter for screening mammogram for malignant neoplasm of breast: Secondary | ICD-10-CM
